# Patient Record
Sex: Female | Born: 1980 | Hispanic: Yes | Marital: Married | Smoking: Never smoker
Health system: Southern US, Community
[De-identification: ages and names within clinical notes are randomized; demographics above are authoritative.]

## PROBLEM LIST (undated history)

## (undated) DIAGNOSIS — E119 Type 2 diabetes mellitus without complications: Secondary | ICD-10-CM

## (undated) DIAGNOSIS — T7840XA Allergy, unspecified, initial encounter: Secondary | ICD-10-CM

## (undated) HISTORY — PX: WISDOM TOOTH EXTRACTION: SHX21

## (undated) HISTORY — DX: Type 2 diabetes mellitus without complications: E11.9

## (undated) HISTORY — PX: TUBAL LIGATION: SHX77

## (undated) HISTORY — DX: Allergy, unspecified, initial encounter: T78.40XA

---

## 2020-11-01 ENCOUNTER — Emergency Department: Payer: Medicaid Other

## 2020-11-01 ENCOUNTER — Other Ambulatory Visit: Payer: Self-pay

## 2020-11-01 ENCOUNTER — Emergency Department
Admission: EM | Admit: 2020-11-01 | Discharge: 2020-11-02 | Disposition: A | Discharge status: Home / Self Care | Payer: Medicaid Other | Attending: Emergency Medicine | Admitting: Emergency Medicine

## 2020-11-01 DIAGNOSIS — E1165 Type 2 diabetes mellitus with hyperglycemia: Secondary | ICD-10-CM | POA: Diagnosis not present

## 2020-11-01 DIAGNOSIS — R55 Syncope and collapse: Secondary | ICD-10-CM | POA: Insufficient documentation

## 2020-11-01 DIAGNOSIS — R079 Chest pain, unspecified: Secondary | ICD-10-CM | POA: Insufficient documentation

## 2020-11-01 DIAGNOSIS — Z7984 Long term (current) use of oral hypoglycemic drugs: Secondary | ICD-10-CM | POA: Diagnosis not present

## 2020-11-01 DIAGNOSIS — E119 Type 2 diabetes mellitus without complications: Secondary | ICD-10-CM

## 2020-11-01 LAB — BASIC METABOLIC PANEL
Anion gap: 12 (ref 5–15)
BUN: 15 mg/dL (ref 6–20)
CO2: 22 mmol/L (ref 22–32)
Calcium: 8.7 mg/dL — ABNORMAL LOW (ref 8.9–10.3)
Chloride: 102 mmol/L (ref 98–111)
Creatinine, Ser: 0.65 mg/dL (ref 0.44–1.00)
GFR, Estimated: >60 mL/min (ref >60)
Glucose, Bld: 228 mg/dL — ABNORMAL HIGH (ref 70–99)
Potassium: 3.9 mmol/L (ref 3.5–5.1)
Sodium: 136 mmol/L (ref 135–145)

## 2020-11-01 LAB — CBC
HCT: 41.1 % (ref 36.0–46.0)
Hemoglobin: 14 g/dL (ref 12.0–15.0)
MCH: 30.1 pg (ref 26.0–34.0)
MCHC: 34.1 g/dL (ref 30.0–36.0)
MCV: 88.4 fL (ref 80.0–100.0)
Platelets: 332 10*3/uL (ref 150–400)
RBC: 4.65 MIL/uL (ref 3.87–5.11)
RDW: 12.7 % (ref 11.5–15.5)
WBC: 10.6 10*3/uL — ABNORMAL HIGH (ref 4.0–10.5)
nRBC: 0 % (ref 0.0–0.2)

## 2020-11-01 LAB — TROPONIN I (HIGH SENSITIVITY)
Troponin I (High Sensitivity): <2 ng/L (ref <18)
Troponin I (High Sensitivity): <2 ng/L (ref <18)

## 2020-11-01 NOTE — ED Provider Notes (Signed)
[facility] Emergency Department Provider Note  ____________________________________________  Time seen: Approximately 11:55 PM  I have reviewed the triage vital signs and the nursing notes.   HISTORY  Chief Complaint Chest Pain   HPI Samantha Bonilla is a 39 y.o. female who presents for evaluation of CP. Patient reports at least 6 similar episodes in the past starting when she was 39 years old and with the last one being 2 years ago while she lived in [place] and was admitted for further work up which according to her was negative. Patient reports that when she gets upset she develops chest pain, she then starts to cry and hyperventilate, and ends up having a syncopal or near syncopal event.  That is what happened today while she was at work.  She reports near syncope.  She denies any current chest pain.  No personal or family history of sudden death, dysrhythmias.  She does not take any hormones.  She denies any personal or family history of blood clots, recent travel immobilization, leg pain or swelling, hemoptysis.  She denies smoking, alcohol or drugs.  No shortness of breath, no fever, no cough, no vomiting or diarrhea.  PMH Chest pain Syncope  Past Surgical History:  Procedure Laterality Date  . TUBAL LIGATION      Prior to Admission medications   Medication Sig Start Date End Date Taking? Authorizing Provider  metFORMIN (GLUCOPHAGE) 500 MG tablet Take 1 tablet (500 mg total) by mouth 2 (two) times daily with a meal. 11/02/20 11/02/21  Nita Sickle, MD    Allergies Penicillins  No family history on file.  Social History Social History   Tobacco Use  . Smoking status: Never Smoker  . Smokeless tobacco: Never Used  Substance Use Topics  . Alcohol use: Not Currently  . Drug use: Not on file    Review of Systems  Constitutional: Negative for fever. + near syncope Eyes: Negative for visual changes. ENT: Negative for sore  throat. Neck: No neck pain  Cardiovascular: + chest pain. Respiratory: Negative for shortness of breath. Gastrointestinal: Negative for abdominal pain, vomiting or diarrhea. Genitourinary: Negative for dysuria. Musculoskeletal: Negative for back pain. Skin: Negative for rash. Neurological: Negative for headaches, weakness or numbness. Psych: No SI or HI  ____________________________________________   PHYSICAL EXAM:  VITAL SIGNS: ED Triage Vitals  Enc Vitals Group     BP 11/01/20 1919 113/76     Pulse Rate 11/01/20 1919 82     Resp 11/01/20 1919 15     Temp 11/01/20 1919 98 F (36.7 C)     Temp src --      SpO2 11/01/20 1919 100 %     Weight 11/01/20 1924 188 lb (85.3 kg)     Height 11/01/20 1924 5' 0.63" (1.54 m)     Head Circumference --      Peak Flow --      Pain Score 11/01/20 1922 0     Pain Loc --      Pain Edu? --      Excl. in GC? --     Constitutional: Alert and oriented. Well appearing and in no apparent distress. HEENT:      Head: Normocephalic and atraumatic.         Eyes: Conjunctivae are normal. Sclera is non-icteric.       Mouth/Throat: Mucous membranes are moist.       Neck: Supple with no signs of meningismus.  No C-spine tenderness  Cardiovascular: Regular rate and rhythm. No murmurs, gallops, or rubs. 2+ symmetrical distal pulses are present in all extremities. No JVD. Respiratory: Normal respiratory effort. Lungs are clear to auscultation bilaterally. No wheezes, crackles, or rhonchi.  Gastrointestinal: Soft, non tender. Musculoskeletal: Nontender with normal range of motion in all extremities. No edema, cyanosis, or erythema of extremities.  No T and L-spine tenderness Neurologic: Normal speech and language. Face is symmetric. Moving all extremities. No gross focal neurologic deficits are appreciated. Skin: Skin is warm, dry and intact. No rash noted. Psychiatric: Mood and affect are normal. Speech and behavior are  normal.  ____________________________________________   LABS (all labs ordered are listed, but only abnormal results are displayed)  Labs Reviewed  BASIC METABOLIC PANEL - Abnormal; Notable for the following components:      Result Value   Glucose, Bld 228 (*)    Calcium 8.7 (*)    All other components within normal limits  CBC - Abnormal; Notable for the following components:   WBC 10.6 (*)    All other components within normal limits  URINE DRUG SCREEN, QUALITATIVE ([facility] ONLY)  HEMOGLOBIN A1C  POC URINE PREG, ED  TROPONIN I (HIGH SENSITIVITY)  TROPONIN I (HIGH SENSITIVITY)   ____________________________________________  EKG  ED ECG REPORT I, Nita Sickle, the attending physician, personally viewed and interpreted this ECG.   Normal sinus rhythm, normal intervals, normal axis, no STE or depressions, no evidence of HOCM, AV block, delta wave, ARVD, prolonged QTc, WPW, or Brugada.   ____________________________________________  RADIOLOGY  I have personally reviewed the images performed during this visit and I agree with the Radiologist's read.   Interpretation by Radiologist:  DG Chest 2 View  Result Date: 11/01/2020 CLINICAL DATA:  Sudden onset of left chest pain at work followed by near syncopal episode, nausea and shortness of breath. EXAM: CHEST - 2 VIEW COMPARISON:  None. FINDINGS: The heart size and mediastinal contours are normal. The lungs are clear. There is no pleural effusion or pneumothorax. No acute osseous findings are identified. IMPRESSION: No active cardiopulmonary process. Electronically Signed   By: Carey Bullocks M.D.   On: 11/01/2020 19:46     ____________________________________________   PROCEDURES  Procedure(s) performed:yes .1-3 Lead EKG Interpretation Performed by: Nita Sickle, MD Authorized by: Nita Sickle, MD     Interpretation: normal     ECG rate assessment: normal     Rhythm: sinus rhythm     Ectopy: none      Critical Care performed:  None ____________________________________________   INITIAL IMPRESSION / ASSESSMENT AND PLAN / ED COURSE  39 y.o. female who presents for evaluation of CP.  Seems patient has had several similar episodes where she develops chest pain when she is upset and hyperventilates which leads to a near syncopal event.  Here she is extremely well-appearing in no distress with normal vital signs.  She is asymptomatic with no chest pain or shortness of breath.  Her EKG shows no signs of ischemia or dysrhythmias.  She was monitored on telemetry with no signs of dysrhythmias as well.  Ddx panic attack, hyperventilation, anxiety. Sounds atypical for ACS, patient low risk with no known comorbidities normal EKG and 2 HS- troponin negative. Pregnancy negative. PERC negative. No signs of anemia on blood work. Patient does have elevated BG 228 consistent with new diagnosis of DM. No signs of DKA or any electrolyte derangements.  Hemoglobin A1c has been sent.  Will start patient on Metformin.  [facility].  Patient provided with extensive information about new diagnosis diabetes, dietary changes, exercise, and Metformin.  Discussed follow-up with PCP and my standard return precautions.  Several attempts to get patient's records from [facility] in [facility] were unsuccessful.      _____________________________________________ Please note:  Patient was evaluated in Emergency Department today for the symptoms described in the history of present illness. Patient was evaluated in the context of the global COVID-19 pandemic, which necessitated consideration that the patient might be at risk for infection with the SARS-CoV-2 virus that causes COVID-19. Institutional protocols and algorithms that pertain to the evaluation of patients at risk for COVID-19 are in a state of rapid change based on information released by regulatory bodies  including the CDC and federal and state organizations. These policies and algorithms were followed during the patient's care in the ED.  Some ED evaluations and interventions may be delayed as a result of limited staffing during the pandemic.   [place] Controlled Substance Database was reviewed by me. ____________________________________________   FINAL CLINICAL IMPRESSION(S) / ED DIAGNOSES   Final diagnoses:  Chest pain, unspecified type  New onset type 2 diabetes mellitus (HCC)      NEW MEDICATIONS STARTED DURING THIS VISIT:  ED Discharge Orders         Ordered    metFORMIN (GLUCOPHAGE) 500 MG tablet  2 times daily with meals        11/02/20 0103           Note:  This document was prepared using Dragon voice recognition software and may include unintentional dictation errors.   Don Perking, [place], MD 11/02/20 0111

## 2020-11-01 NOTE — ED Triage Notes (Addendum)
Patient reports sudden onset of left chest pain at work followed by near syncope, nausea, and SOB. Patient reports radiation to left arm. Patient describes pain as tightness/squeezing  Patient denies pain currently.

## 2020-11-02 LAB — URINE DRUG SCREEN, QUALITATIVE (ARMC ONLY)
Amphetamines, Ur Screen: NOT DETECTED
Barbiturates, Ur Screen: NOT DETECTED
Benzodiazepine, Ur Scrn: NOT DETECTED
Cannabinoid 50 Ng, Ur ~~LOC~~: NOT DETECTED
Cocaine Metabolite,Ur ~~LOC~~: NOT DETECTED
MDMA (Ecstasy)Ur Screen: NOT DETECTED
Methadone Scn, Ur: NOT DETECTED
Opiate, Ur Screen: NOT DETECTED
Phencyclidine (PCP) Ur S: NOT DETECTED
Tricyclic, Ur Screen: NOT DETECTED

## 2020-11-02 LAB — HEMOGLOBIN A1C
Hgb A1c MFr Bld: 9.6 % — ABNORMAL HIGH (ref 4.8–5.6)
Mean Plasma Glucose: 228.82 mg/dL

## 2020-11-02 LAB — POC URINE PREG, ED: Preg Test, Ur: NEGATIVE

## 2020-11-02 MED ORDER — METFORMIN HCL 500 MG PO TABS: 500.0000 mg | ORAL_TABLET | Freq: Two times a day (BID) | ORAL | 1 refills | Status: DC

## 2021-01-18 ENCOUNTER — Emergency Department: Payer: Medicaid Other

## 2021-01-18 ENCOUNTER — Other Ambulatory Visit: Payer: Self-pay

## 2021-01-18 ENCOUNTER — Emergency Department
Admission: EM | Admit: 2021-01-18 | Discharge: 2021-01-18 | Disposition: A | Discharge status: Home / Self Care | Payer: Medicaid Other | Attending: Emergency Medicine | Admitting: Emergency Medicine

## 2021-01-18 DIAGNOSIS — R29818 Other symptoms and signs involving the nervous system: Secondary | ICD-10-CM | POA: Diagnosis not present

## 2021-01-18 DIAGNOSIS — G43809 Other migraine, not intractable, without status migrainosus: Secondary | ICD-10-CM | POA: Diagnosis not present

## 2021-01-18 DIAGNOSIS — G43819 Other migraine, intractable, without status migrainosus: Secondary | ICD-10-CM | POA: Insufficient documentation

## 2021-01-18 DIAGNOSIS — R079 Chest pain, unspecified: Secondary | ICD-10-CM | POA: Diagnosis not present

## 2021-01-18 DIAGNOSIS — M25512 Pain in left shoulder: Secondary | ICD-10-CM | POA: Diagnosis not present

## 2021-01-18 DIAGNOSIS — R531 Weakness: Secondary | ICD-10-CM | POA: Diagnosis not present

## 2021-01-18 DIAGNOSIS — R29810 Facial weakness: Secondary | ICD-10-CM | POA: Diagnosis not present

## 2021-01-18 DIAGNOSIS — G43909 Migraine, unspecified, not intractable, without status migrainosus: Secondary | ICD-10-CM

## 2021-01-18 DIAGNOSIS — R519 Headache, unspecified: Secondary | ICD-10-CM | POA: Diagnosis not present

## 2021-01-18 DIAGNOSIS — R202 Paresthesia of skin: Secondary | ICD-10-CM | POA: Diagnosis present

## 2021-01-18 DIAGNOSIS — R29898 Other symptoms and signs involving the musculoskeletal system: Secondary | ICD-10-CM | POA: Diagnosis not present

## 2021-01-18 DIAGNOSIS — R4781 Slurred speech: Secondary | ICD-10-CM | POA: Diagnosis not present

## 2021-01-18 LAB — DIFFERENTIAL
Abs Immature Granulocytes: 0.02 10*3/uL (ref 0.00–0.07)
Basophils Absolute: 0.1 10*3/uL (ref 0.0–0.1)
Basophils Relative: 1 %
Eosinophils Absolute: 0.1 10*3/uL (ref 0.0–0.5)
Eosinophils Relative: 1 %
Immature Granulocytes: 0 %
Lymphocytes Relative: 33 %
Lymphs Abs: 3.3 10*3/uL (ref 0.7–4.0)
Monocytes Absolute: 0.6 10*3/uL (ref 0.1–1.0)
Monocytes Relative: 6 %
Neutro Abs: 5.9 10*3/uL (ref 1.7–7.7)
Neutrophils Relative %: 59 %

## 2021-01-18 LAB — COMPREHENSIVE METABOLIC PANEL
ALT: 23 U/L (ref 0–44)
AST: 23 U/L (ref 15–41)
Albumin: 4 g/dL (ref 3.5–5.0)
Alkaline Phosphatase: 71 U/L (ref 38–126)
Anion gap: 10 (ref 5–15)
BUN: 11 mg/dL (ref 6–20)
CO2: 24 mmol/L (ref 22–32)
Calcium: 9.4 mg/dL (ref 8.9–10.3)
Chloride: 103 mmol/L (ref 98–111)
Creatinine, Ser: 0.71 mg/dL (ref 0.44–1.00)
GFR, Estimated: >60 mL/min (ref >60)
Glucose, Bld: 183 mg/dL — ABNORMAL HIGH (ref 70–99)
Potassium: 4.1 mmol/L (ref 3.5–5.1)
Sodium: 137 mmol/L (ref 135–145)
Total Bilirubin: 1.3 mg/dL — ABNORMAL HIGH (ref 0.3–1.2)
Total Protein: 7.3 g/dL (ref 6.5–8.1)

## 2021-01-18 LAB — CBC
HCT: 41.1 % (ref 36.0–46.0)
Hemoglobin: 14.2 g/dL (ref 12.0–15.0)
MCH: 30.7 pg (ref 26.0–34.0)
MCHC: 34.5 g/dL (ref 30.0–36.0)
MCV: 88.8 fL (ref 80.0–100.0)
Platelets: 253 10*3/uL (ref 150–400)
RBC: 4.63 MIL/uL (ref 3.87–5.11)
RDW: 12.7 % (ref 11.5–15.5)
WBC: 10 10*3/uL (ref 4.0–10.5)
nRBC: 0 % (ref 0.0–0.2)

## 2021-01-18 LAB — TROPONIN I (HIGH SENSITIVITY)
Troponin I (High Sensitivity): <2 ng/L (ref <18)
Troponin I (High Sensitivity): <2 ng/L (ref <18)

## 2021-01-18 LAB — POC URINE PREG, ED: Preg Test, Ur: NEGATIVE

## 2021-01-18 LAB — CBG MONITORING, ED: Glucose-Capillary: 170 mg/dL — ABNORMAL HIGH (ref 70–99)

## 2021-01-18 MED ORDER — METOCLOPRAMIDE HCL 5 MG/ML IJ SOLN
10.0000 mg | Freq: Once | INTRAMUSCULAR | Status: AC
  Administered 2021-01-18: 10 mg via INTRAVENOUS
  Filled 2021-01-18: qty 2

## 2021-01-18 MED ORDER — DIPHENHYDRAMINE HCL 50 MG/ML IJ SOLN
12.5000 mg | Freq: Once | INTRAMUSCULAR | Status: AC
  Administered 2021-01-18: 12.5 mg via INTRAVENOUS
  Filled 2021-01-18: qty 1

## 2021-01-18 MED ORDER — ACETAMINOPHEN 325 MG PO TABS
650.0000 mg | ORAL_TABLET | Freq: Once | ORAL | Status: AC
  Administered 2021-01-18: 650 mg via ORAL
  Filled 2021-01-18: qty 2

## 2021-01-18 MED ORDER — SODIUM CHLORIDE 0.9% FLUSH
3.0000 mL | Freq: Once | INTRAVENOUS | Status: AC
  Administered 2021-01-18: 3 mL via INTRAVENOUS

## 2021-01-18 NOTE — ED Notes (Signed)
Patient transported to X-ray 

## 2021-01-18 NOTE — ED Notes (Signed)
Code stroke called to [facility] [phone]

## 2021-01-18 NOTE — ED Provider Notes (Signed)
[facility] Emergency Department Provider Note  ____________________________________________   Event Date/Time   First MD Initiated Contact with Patient 01/18/21 1457     (approximate)  I have reviewed the triage vital signs and the nursing notes.   HISTORY  Chief Complaint Possible Stroke    HPI Samantha Bonilla is a 40 y.o. female with history of migraines who comes in with tingling and weakness.  Patient states that she had a headache that started last night.  She then went to bed at 8 AM for a nap and woke up at 11 with left sided arm weakness and sensation changes on the left side of her body.  This is been constant, nothing made it better, nothing made it worse.  She states that she does have some pain in her left shoulder but it is only with movement and occasionally radiates into her chest.  When she is completely still she has no pain.  She states that she has had migraines in the past that she has had some tingling but never had the weakness before which is what made her present today.  She has never had a stroke previously.  She is on a blood thinners.  She denies any shortness of breath, abdominal pain.          History reviewed. No pertinent past medical history.  There are no problems to display for this patient.   Past Surgical History:  Procedure Laterality Date  . TUBAL LIGATION      Prior to Admission medications   Medication Sig Start Date End Date Taking? Authorizing Provider  metFORMIN (GLUCOPHAGE) 500 MG tablet Take 1 tablet (500 mg total) by mouth 2 (two) times daily with a meal. 11/02/20 11/02/21  Nita Sickle, MD    Allergies Penicillins  No family history on file.  Social History Social History   Tobacco Use  . Smoking status: Never Smoker  . Smokeless tobacco: Never Used  Substance Use Topics  . Alcohol use: Not Currently      Review of Systems Constitutional: No fever/chills Eyes: No visual  changes. ENT: No sore throat. Cardiovascular: Positive chest pain Respiratory: Denies shortness of breath. Gastrointestinal: No abdominal pain.  No nausea, no vomiting.  No diarrhea.  No constipation. Genitourinary: Negative for dysuria. Musculoskeletal: Negative for back pain.  Positive shoulder pain Skin: Negative for rash. Neurological: Positive headache and left-sided tingling and weakness all other ROS negative ____________________________________________   PHYSICAL EXAM:  VITAL SIGNS: ED Triage Vitals  Enc Vitals Group     BP 01/18/21 1450 127/72     Pulse Rate 01/18/21 1450 72     Resp 01/18/21 1450 18     Temp 01/18/21 1450 98 F (36.7 C)     Temp src --      SpO2 01/18/21 1450 100 %     Weight 01/18/21 1518 176 lb 12.9 oz (80.2 kg)     Height --      Head Circumference --      Peak Flow --      Pain Score 01/18/21 1451 6     Pain Loc --      Pain Edu? --      Excl. in GC? --     Constitutional: Alert and oriented. Well appearing and in no acute distress. Eyes: Conjunctivae are normal. EOMI. Head: Atraumatic. Nose: No congestion/rhinnorhea. Mouth/Throat: Mucous membranes are moist.   Neck: No stridor. Trachea Midline. FROM Cardiovascular: Normal rate, regular  rhythm. Grossly normal heart sounds.  Good peripheral circulation. Respiratory: Normal respiratory effort.  No retractions. Lungs CTAB. Gastrointestinal: Soft and nontender. No distention. No abdominal bruits.  Musculoskeletal: No lower extremity tenderness nor edema.  No joint effusions. Neurologic: NIH stroke scale of 2 for sensation changes on the face and the left arm. Skin:  Skin is warm, dry and intact. No rash noted. Psychiatric: Mood and affect are normal. Speech and behavior are normal. GU: Deferred   ____________________________________________   LABS (all labs ordered are listed, but only abnormal results are displayed)  Labs Reviewed  CBG MONITORING, ED - Abnormal; Notable for the  following components:      Result Value   Glucose-Capillary 170 (*)    All other components within normal limits  CBC  DIFFERENTIAL  COMPREHENSIVE METABOLIC PANEL  POC URINE PREG, ED  I-STAT CREATININE, ED  TROPONIN I (HIGH SENSITIVITY)   ____________________________________________   ED ECG REPORT I, Concha Se, the attending physician, personally viewed and interpreted this ECG.  Normal sinus rate of 77, no ST elevation, no T wave inversions, normal intervals ____________________________________________  RADIOLOGY Vela Prose, personally viewed and evaluated these images (plain radiographs) as part of my medical decision making, as well as reviewing the written report by the radiologist.  ED MD interpretation: CT head negative for intracranial hemorrhage  Official radiology report(s): CT HEAD CODE STROKE WO CONTRAST  Result Date: 01/18/2021 CLINICAL DATA:  Code stroke. Acute neuro deficit. Headache. Weakness and slurred speech EXAM: CT HEAD WITHOUT CONTRAST TECHNIQUE: Contiguous axial images were obtained from the base of the skull through the vertex without intravenous contrast. COMPARISON:  None. FINDINGS: Brain: No evidence of acute infarction, hemorrhage, hydrocephalus, extra-axial collection or mass lesion/mass effect. Empty sella, likely incidental. Vascular: Negative for hyperdense vessel Skull: Negative Sinuses/Orbits: Paranasal sinuses clear.  Negative orbit Other: None ASPECTS (Alberta Stroke Program Early CT Score) - Ganglionic level infarction (caudate, lentiform nuclei, internal capsule, insula, M1-M3 cortex): 7 - Supraganglionic infarction (M4-M6 cortex): 3 Total score (0-10 with 10 being normal): 10 IMPRESSION: 1. Negative CT head 2. ASPECTS is 10 3. These results were called by telephone at the time of interpretation on 01/18/2021 at 3:07 pm to provider Z Katrinka Blazing, who verbally acknowledged these results. Electronically Signed   By: Marlan Palau M.D.   On: 01/18/2021  15:08    ____________________________________________   PROCEDURES  Procedure(s) performed (including Critical Care):  Procedures   ____________________________________________   INITIAL IMPRESSION / ASSESSMENT AND PLAN / ED COURSE  Samantha Bonilla was evaluated in Emergency Department on 01/18/2021 for the symptoms described in the history of present illness. She was evaluated in the context of the global COVID-19 pandemic, which necessitated consideration that the patient might be at risk for infection with the SARS-CoV-2 virus that causes COVID-19. Institutional protocols and algorithms that pertain to the evaluation of patients at risk for COVID-19 are in a state of rapid change based on information released by regulatory bodies including the CDC and federal and state organizations. These policies and algorithms were followed during the patient's care in the ED.     Patient is a 40 year old Spanish-speaking patient who comes in with headache and left-sided weakness.  Last known normal was 8 AM on 01/18/2021.  Patient's of the window of TPA but stroke code was called from triage.  Patient exam seems to be changing more occasionally seems to have weakness in her hands and occasionally she is able to have good  strength in her left hand.  Patient was evaluated by neurology.  CT head was ordered to evaluate for intercranial hemorrhage.  This time low suspicion for LVO and her NIH stroke scale is so low that she would not be a candidate for thrombectomy.  They did recommend MRI to rule out stroke although our suspicion that this is more likely a complex migraine.  I did also consider the thought of dissection given patient reports some shoulder pain and chest pain however she has no pain when sitting completely still and her pain is only when she moves her left shoulder.  She has good pulses throughout chest x-ray does not show any widened mediastinum.  However I will get EKG and cardiac  markers to evaluate for ACS.  However again I suspect that this is more likely musculoskeletal in nature.  Labs are reassuring.  Initial cardiac marker is negative.  CT head is negative  5:17 PM symptoms are resolving.  Stroke scale was currently 0 still pending MRI  6:04 PM reevaluated patient and she has no symptoms at this time.  Denies any pain.  Still waiting for MRI.  Updated on results thus far.  7:03 PM MRI is negative.  Patient mains asymptomatic.  This time will discharge home.  Most likely complex migraine.    ____________________________________________   FINAL CLINICAL IMPRESSION(S) / ED DIAGNOSES   Final diagnoses:  Other migraine without status migrainosus, intractable      MEDICATIONS GIVEN DURING THIS VISIT:  Medications  sodium chloride flush (NS) 0.9 % injection 3 mL (3 mLs Intravenous Given 01/18/21 1612)  metoCLOPramide (REGLAN) injection 10 mg (10 mg Intravenous Given 01/18/21 1605)  diphenhydrAMINE (BENADRYL) injection 12.5 mg (12.5 mg Intravenous Given 01/18/21 1605)  acetaminophen (TYLENOL) tablet 650 mg (650 mg Oral Given 01/18/21 1612)     ED Discharge Orders    None       Note:  This document was prepared using Dragon voice recognition software and may include unintentional dictation errors.   Concha Se, MD 01/18/21 [phone]

## 2021-01-18 NOTE — ED Triage Notes (Addendum)
Pt comes via POV from home with c/o headache that started around 8pm last night. Pt then had some arm numbness, weakness, slurred speech  that all started at 11am.   Currently no slurred speech. Pt states pain in left arm from shoulder to chest. Pt states stiff neck.  Pt states touch is different in left arm vs right arm.

## 2021-01-18 NOTE — ED Notes (Signed)
Pt ambulatory to toilet in the room with touch assist x1

## 2021-01-18 NOTE — Consult Note (Signed)
Neurology Consultation Reason for Consult: Left sided numbness Referring Physician: Rennis Chris  CC: Headache  History is obtained from: Patient  HPI: Samantha Bonilla is a 40 y.o. female with a history of migraines who presents with left-sided numbness and visual change that was present on awakening at 11 AM.  She felt normal when she took her kids to school other than the five that she already had a headache, when she came back home she laid down for a nap at 8 AM, and when she awoke at 11 AM she noticed a pretty distinct difference.  She noticed some pain in her left arm, as well as numbness and weakness of the arm.  Her headache is on the entire left side of her head from the front to the back, unilateral, associated with photophobia.  LKW: 8 AM tpa given?: no, out of window    ROS: A 14 point ROS was performed and is negative except as noted in the HPI.  History reviewed. No pertinent past medical history.   FHx: Grandmother - stroke  Social History:  reports that she has never smoked. She has never used smokeless tobacco. She reports previous alcohol use. No history on file for drug use.   Exam: Current vital signs: BP 121/75 (BP Location: Right Arm)   Pulse 81   Temp 98 F (36.7 C)   Resp (!) 24   Wt 80.2 kg   SpO2 100%   BMI 33.82 kg/m  Vital signs in last 24 hours: Temp:  [98 F (36.7 C)] 98 F (36.7 C) (02/03 1450) Pulse Rate:  [72-81] 81 (02/03 1536) Resp:  [18-24] 24 (02/03 1536) BP: (121-127)/(72-75) 121/75 (02/03 1536) SpO2:  [100 %] 100 % (02/03 1536) Weight:  [80.2 kg] 80.2 kg (02/03 1518)   Physical Exam  Constitutional: Appears well-developed and well-nourished.  Psych: Affect appropriate to situation Eyes: No scleral injection HENT: No OP obstruction MSK: no joint deformities.  Cardiovascular: Normal rate and regular rhythm.  Respiratory: Effort normal, non-labored breathing GI: Soft.  No distension. There is no tenderness.  Skin:  WDI  Neuro: Mental Status: Patient is awake, alert, oriented to person, place, month, year, and situation. Patient is able to give a clear and coherent history. No signs of aphasia or neglect Cranial Nerves: II: Visual Fields are full. Pupils are equal, round, and reactive to light.   III,IV, VI: EOMI without ptosis or diploplia.  V: Facial sensation is symmetric to temperature VII: Facial movement with decreased movement on the right side of her face VIII: hearing is intact to voice X: Uvula elevates symmetrically XI: Shoulder shrug is symmetric. XII: tongue is midline without atrophy or fasciculations.  Motor: Tone is normal. Bulk is normal. 5/5 strength was present in all four extremities.  She initially does not give very good effort in her left upper extremity, but with encouragement she does get full strength.  She does not have any drift. Sensory: Sensation is diminished throughout the left side Cerebellar: No clear ataxia on finger-nose-finger or heel-knee-shin     I have reviewed labs in epic and the results pertinent to this consultation are: Glucose 183  I have reviewed the images obtained: CT head-negative  Impression: 40 year old female with a history of headaches that sound very much like migraines who presents with headache since yesterday as well as some numbness on the left side.  She has a history of complicated migraine (visual aura) and I suspect that that is what we are  dealing with now.  Her symptoms are mild and she would not be an interventional candidate, and is outside the window for IV TPA therefore I would pursue diagnosis with MRI.  If this is negative, then no further work-up is needed.  Recommendations: 1) MRI brain 2) if negative then no further work-up is needed.   Ritta Slot, MD [facility] [phone]  If 7pm- 7am, please page neurology on call as listed in AMION.

## 2021-01-18 NOTE — Progress Notes (Signed)
CODE STROKE- PHARMACY COMMUNICATION   Time CODE STROKE called/page received: 1500  Time response to CODE STROKE was made: 1510  Time Stroke Kit retrieved from Pyxis: N/A, no tPA per neurologist  Name of Provider/Nurse contacted: Dr. Rickard Patience 01/18/2021  3:26 PM

## 2021-01-18 NOTE — ED Notes (Signed)
Pt transported to CT at this time and then to ED 13 for further evaluation. RN Zollie Scale at bedside to transport pt.

## 2021-01-18 NOTE — ED Notes (Signed)
MD Fuller Plan assessing pt at this time. Code Stroke already called by this RN. MD Fuller Plan agrees to continue CODE STROKE CALL and proceed to CT

## 2021-01-18 NOTE — ED Notes (Signed)
CBG 170 

## 2021-01-18 NOTE — ED Notes (Signed)
Patient transported to MRI 

## 2021-01-18 NOTE — Discharge Instructions (Addendum)
Your MRI was negative for stroke.  We suspect this is more likely a complex migraine.  Take Tylenol 1 g every 8 hours or ibuprofen 600 every 6 hours for pain.  Return to the ER for worsening symptoms or any other concerns

## 2021-01-29 DIAGNOSIS — M62838 Other muscle spasm: Secondary | ICD-10-CM | POA: Diagnosis not present

## 2021-01-29 DIAGNOSIS — S46812A Strain of other muscles, fascia and tendons at shoulder and upper arm level, left arm, initial encounter: Secondary | ICD-10-CM | POA: Diagnosis not present

## 2021-03-17 DIAGNOSIS — H524 Presbyopia: Secondary | ICD-10-CM | POA: Diagnosis not present

## 2021-03-17 DIAGNOSIS — H5203 Hypermetropia, bilateral: Secondary | ICD-10-CM | POA: Diagnosis not present

## 2021-03-17 DIAGNOSIS — H52223 Regular astigmatism, bilateral: Secondary | ICD-10-CM | POA: Diagnosis not present

## 2021-04-09 DIAGNOSIS — H60502 Unspecified acute noninfective otitis externa, left ear: Secondary | ICD-10-CM | POA: Diagnosis not present

## 2021-04-27 DIAGNOSIS — H5213 Myopia, bilateral: Secondary | ICD-10-CM | POA: Diagnosis not present

## 2021-07-10 DIAGNOSIS — R300 Dysuria: Secondary | ICD-10-CM | POA: Diagnosis not present

## 2021-07-10 DIAGNOSIS — N23 Unspecified renal colic: Secondary | ICD-10-CM | POA: Diagnosis not present

## 2021-07-10 DIAGNOSIS — R81 Glycosuria: Secondary | ICD-10-CM | POA: Diagnosis not present

## 2021-09-07 DIAGNOSIS — K219 Gastro-esophageal reflux disease without esophagitis: Secondary | ICD-10-CM | POA: Diagnosis not present

## 2021-09-07 DIAGNOSIS — Z8673 Personal history of transient ischemic attack (TIA), and cerebral infarction without residual deficits: Secondary | ICD-10-CM | POA: Diagnosis not present

## 2021-09-07 DIAGNOSIS — R739 Hyperglycemia, unspecified: Secondary | ICD-10-CM | POA: Diagnosis not present

## 2021-09-19 DIAGNOSIS — Z20822 Contact with and (suspected) exposure to covid-19: Secondary | ICD-10-CM | POA: Diagnosis not present

## 2022-03-11 DIAGNOSIS — A084 Viral intestinal infection, unspecified: Secondary | ICD-10-CM | POA: Diagnosis not present

## 2022-03-11 DIAGNOSIS — R12 Heartburn: Secondary | ICD-10-CM | POA: Diagnosis not present

## 2022-04-09 DIAGNOSIS — S52501D Unspecified fracture of the lower end of right radius, subsequent encounter for closed fracture with routine healing: Secondary | ICD-10-CM | POA: Diagnosis not present

## 2022-04-11 DIAGNOSIS — J019 Acute sinusitis, unspecified: Secondary | ICD-10-CM | POA: Diagnosis not present

## 2022-04-11 DIAGNOSIS — B9689 Other specified bacterial agents as the cause of diseases classified elsewhere: Secondary | ICD-10-CM | POA: Diagnosis not present

## 2022-04-20 IMAGING — CR DG CHEST 2V
1 series · 2 of 2 positions shown · non-contrast
Comparison: None.

CLINICAL DATA: Sudden onset of left chest pain at work followed by
near syncopal episode, nausea and shortness of breath.

EXAM:
CHEST - 2 VIEW

[Series 1: dg chest 2 view · 0.14mm/px · 2 of 2 slices shown]
[im 1/2]
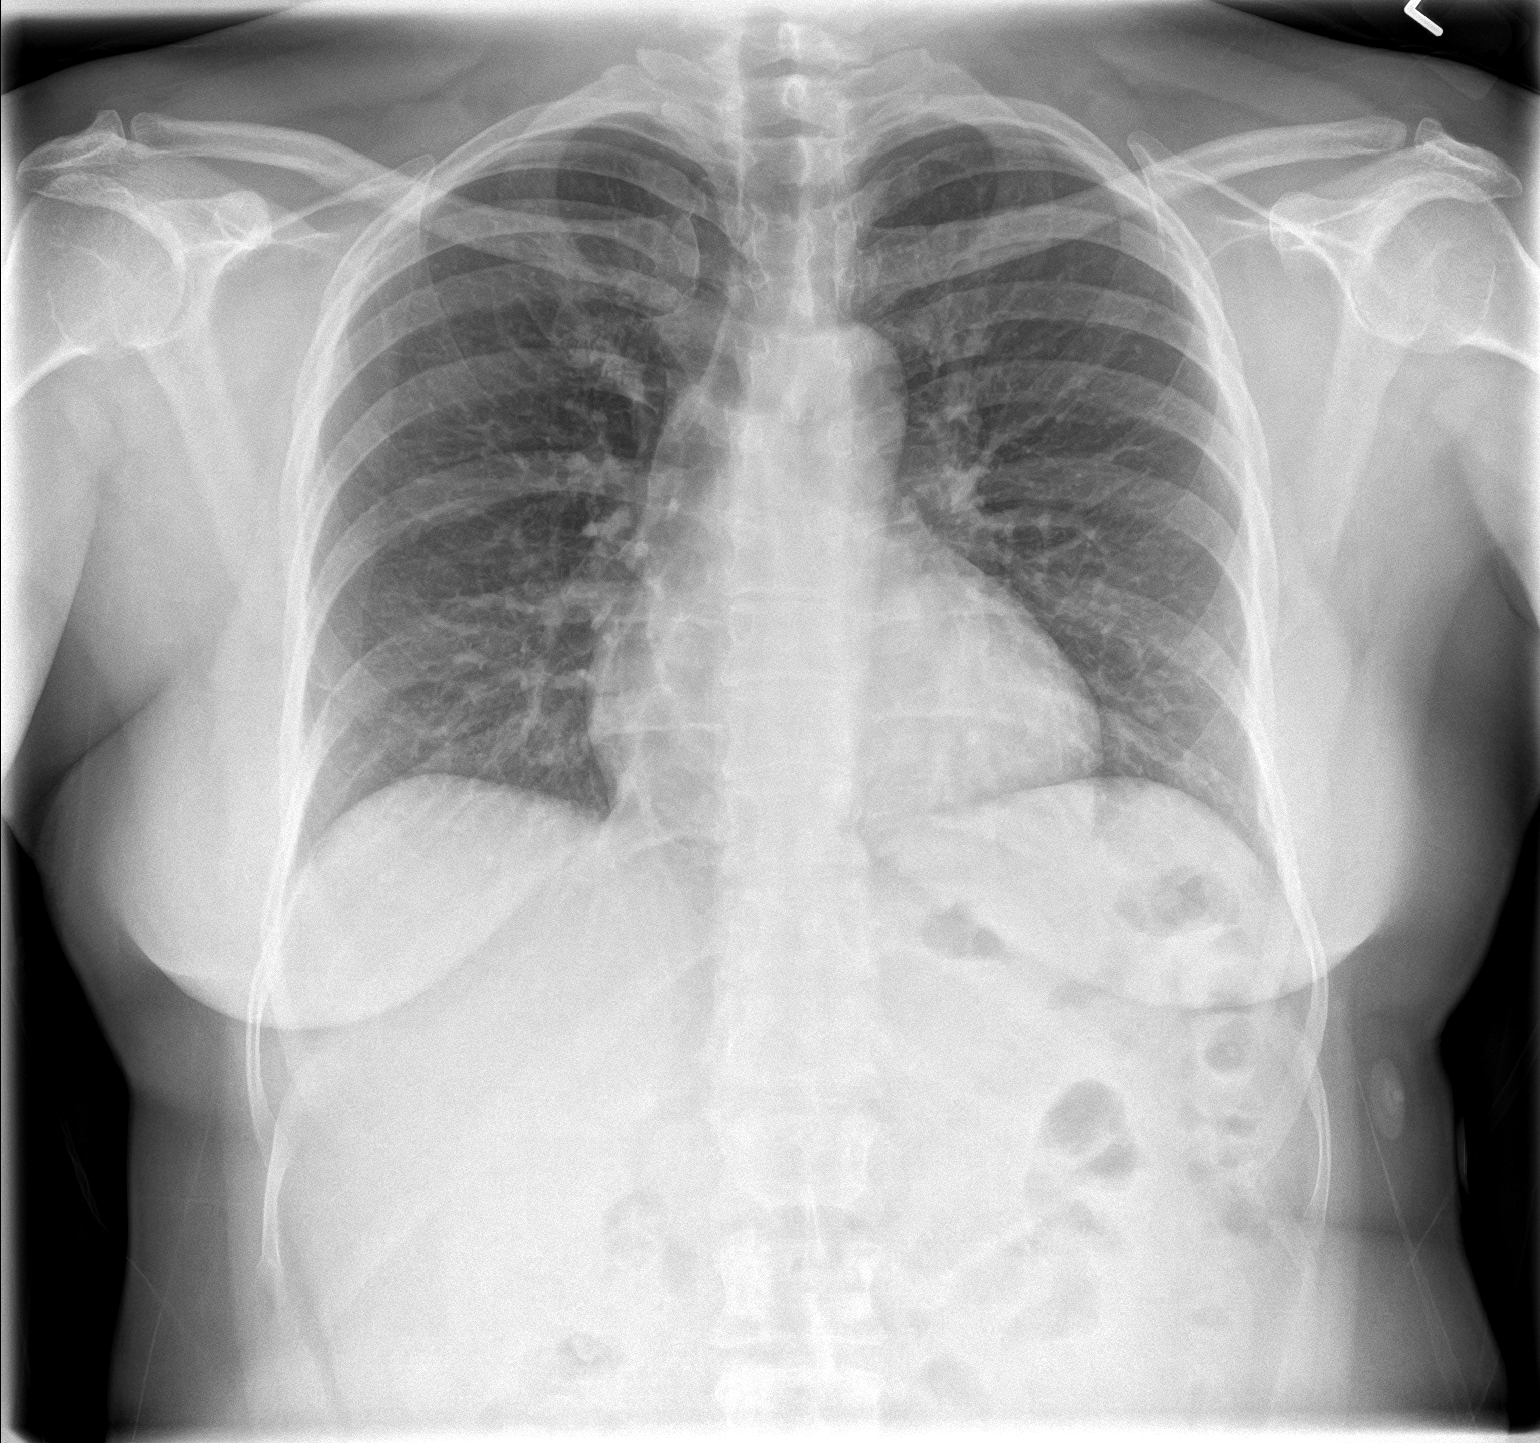
[im 2/2]
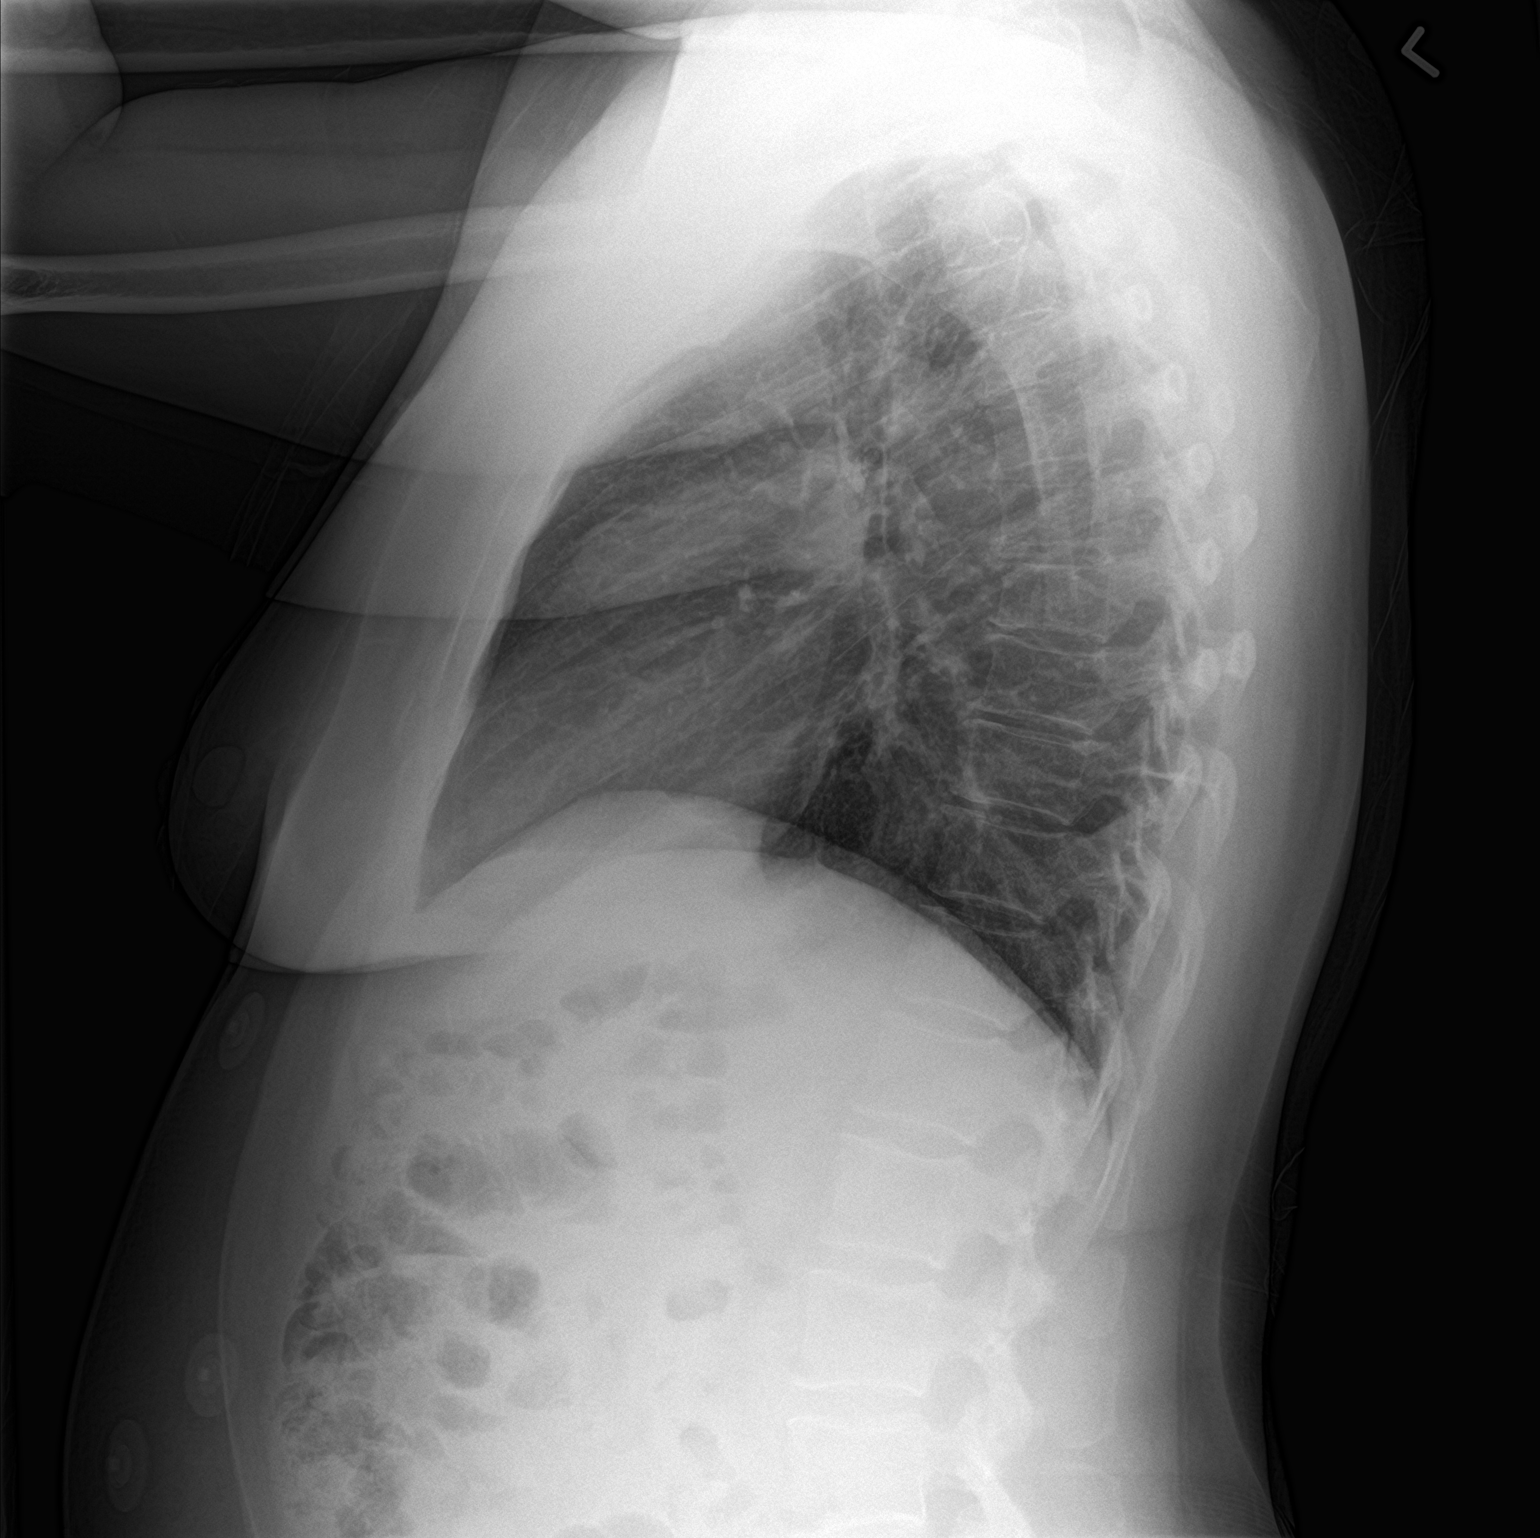

[2 of 2 positions shown; findings below may reference images not displayed]

FINDINGS: The heart size and mediastinal contours are normal. The lungs are
clear. There is no pleural effusion or pneumothorax. No acute
osseous findings are identified.
IMPRESSION: No active cardiopulmonary process.

## 2022-05-07 ENCOUNTER — Other Ambulatory Visit: Payer: Self-pay | Admitting: Physician Assistant

## 2022-05-07 DIAGNOSIS — M25532 Pain in left wrist: Secondary | ICD-10-CM

## 2022-05-22 ENCOUNTER — Ambulatory Visit
Admission: RE | Admit: 2022-05-22 | Discharge: 2022-05-22 | Disposition: A | Discharge status: Home / Self Care | Payer: Worker's Compensation | Source: Ambulatory Visit | Attending: Physician Assistant | Admitting: Physician Assistant

## 2022-05-22 DIAGNOSIS — M25532 Pain in left wrist: Secondary | ICD-10-CM

## 2022-06-04 DIAGNOSIS — M25532 Pain in left wrist: Secondary | ICD-10-CM

## 2022-06-04 HISTORY — DX: Pain in left wrist: M25.532

## 2022-07-07 IMAGING — CT CT HEAD CODE STROKE
3 series · 15 of 47 positions shown, 18 images · non-contrast
Comparison: None.

CLINICAL DATA: Code stroke. Acute neuro deficit. Headache. Weakness
and slurred speech

EXAM:
CT HEAD WITHOUT CONTRAST
TECHNIQUE: Contiguous axial images were obtained from the base of the skull
through the vertex without intravenous contrast.

[Series 3: head wo · axial · 0.41mm/px · z∈[-142,-17]mm · 9 of 31 slices shown, 12 images]
[im 3/31  brain]
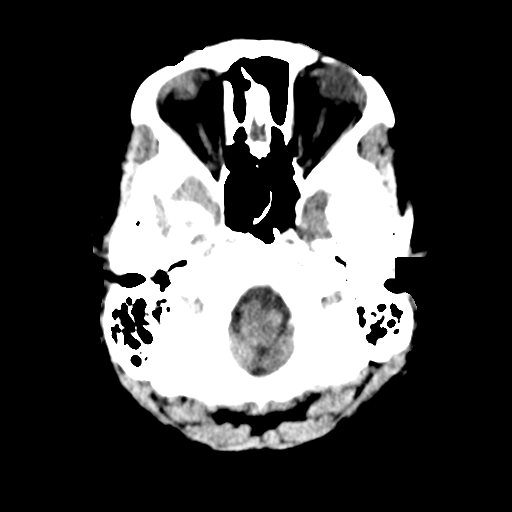
[im 3/31  bone]
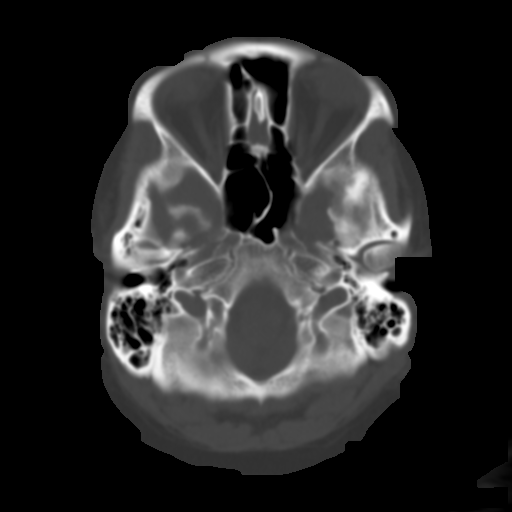
[im 6/31  brain]
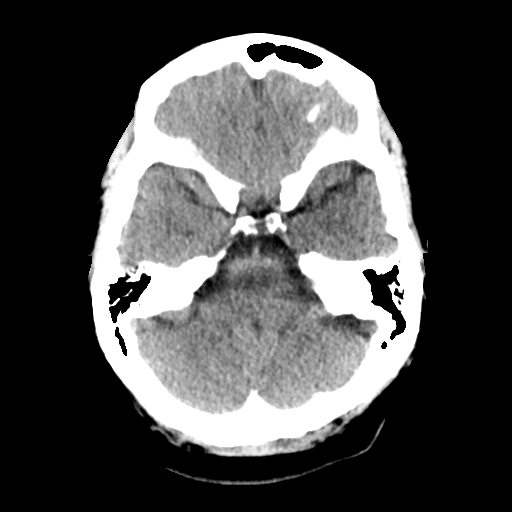
[im 9/31  brain]
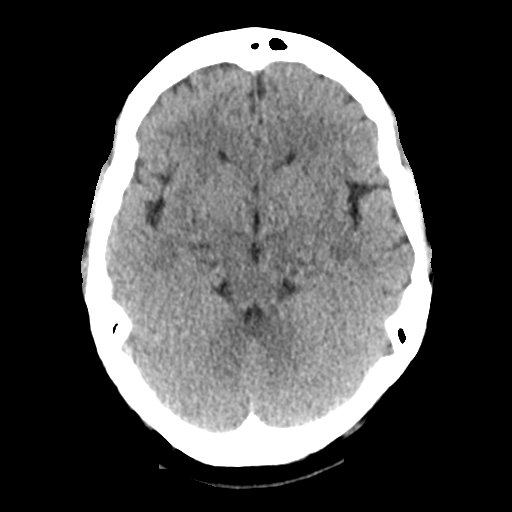
[im 12/31  brain]
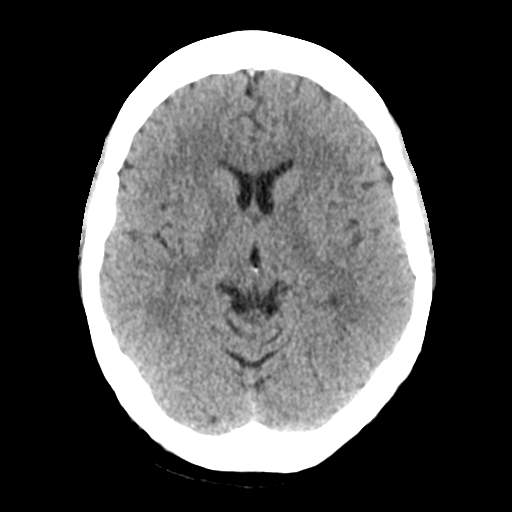
[im 16/31  brain]
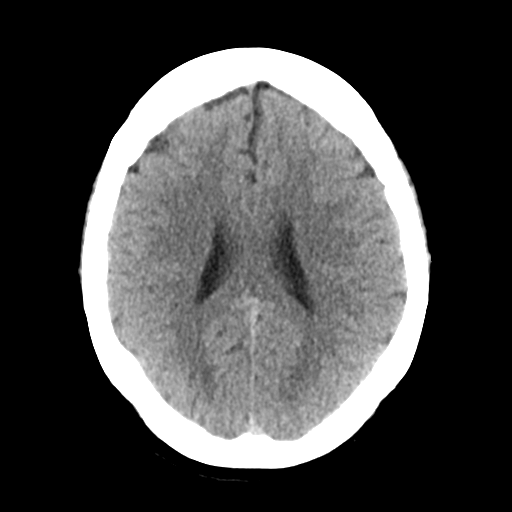
[im 16/31  bone]
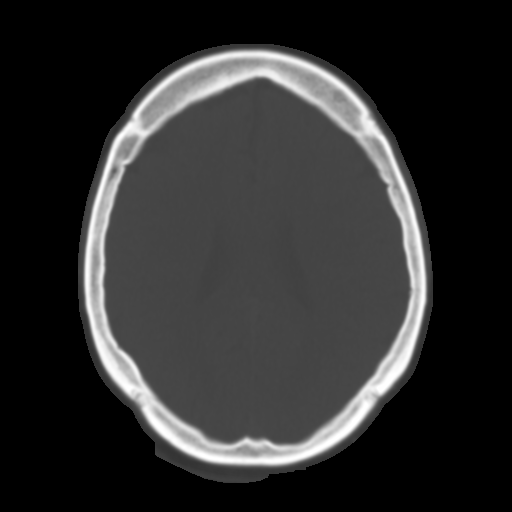
[im 19/31  brain]
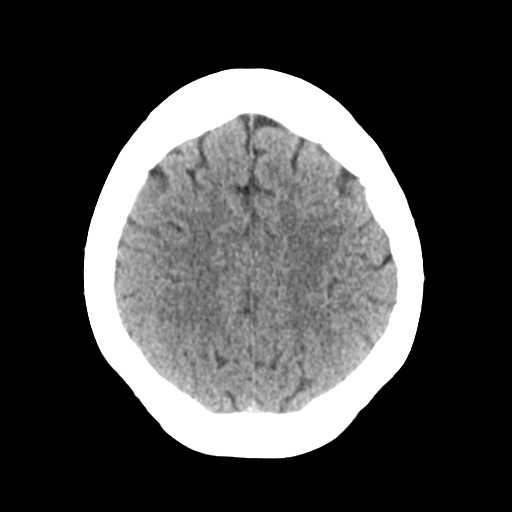
[im 22/31  brain]
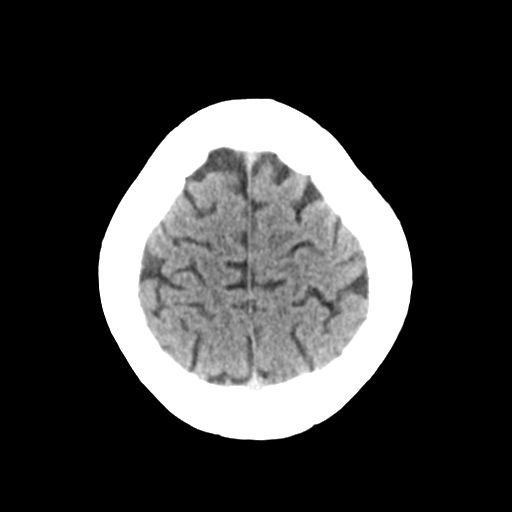
[im 25/31  brain]
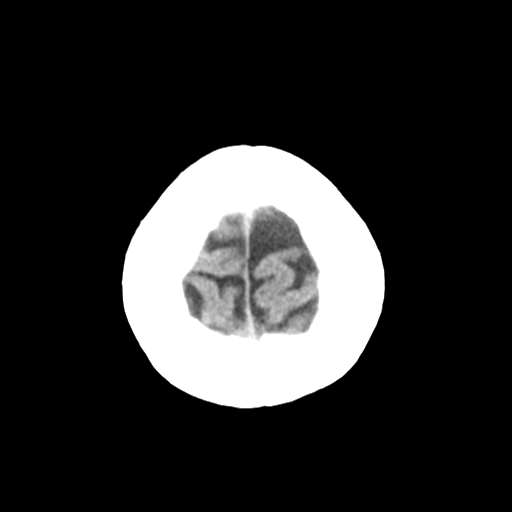
[im 28/31  brain]
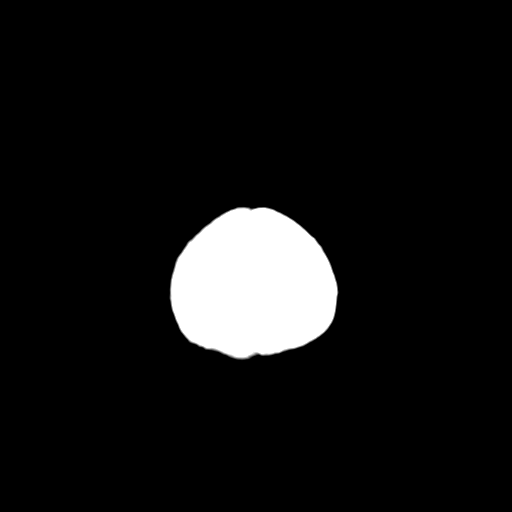
[im 28/31  bone]
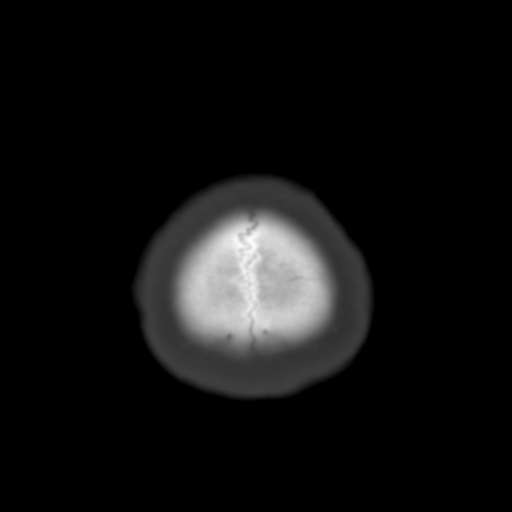

[Series 5: coronal soft tissue · coronal · 0.31mm/px · 3 of 64 slices shown]
[im 22/64  brain]
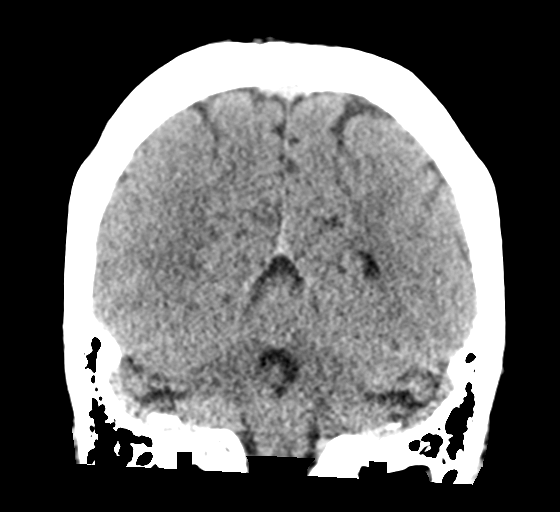
[im 29/64  brain]
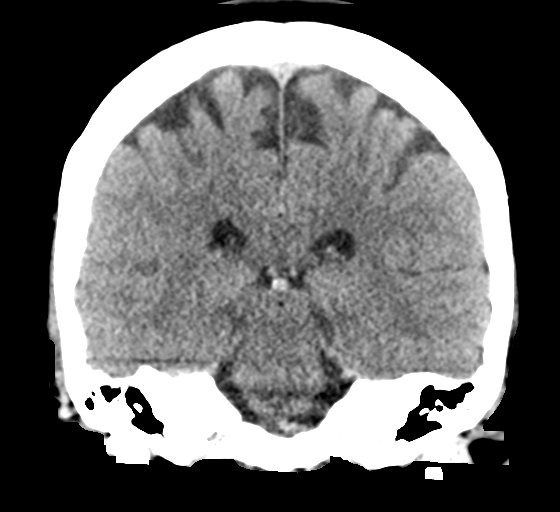
[im 36/64  brain]
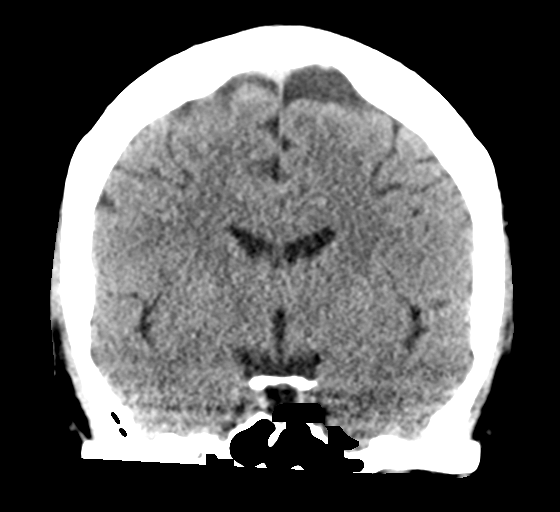

[Series 6: sagittal soft tissue · sagittal · 0.33mm/px · 3 of 55 slices shown]
[im 19/55  brain]
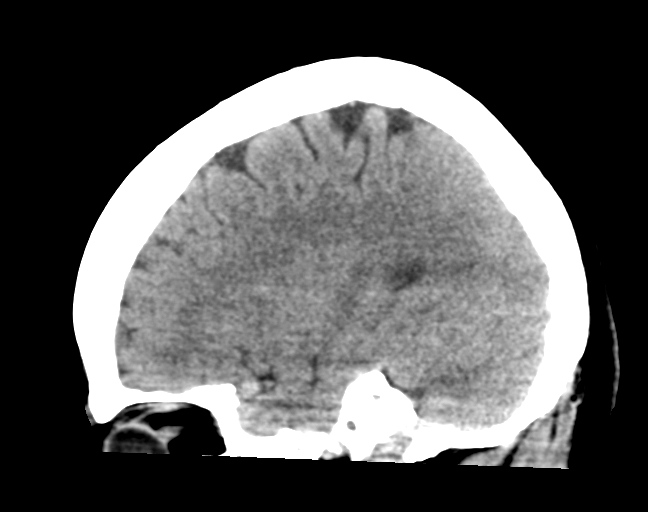
[im 28/55  brain]
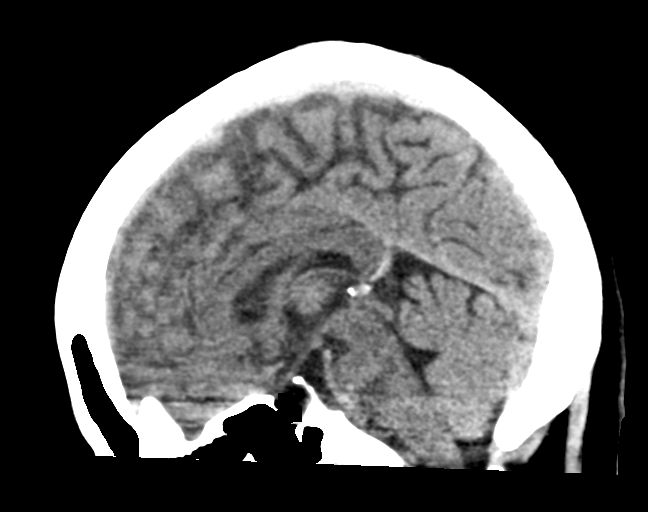
[im 37/55  brain]
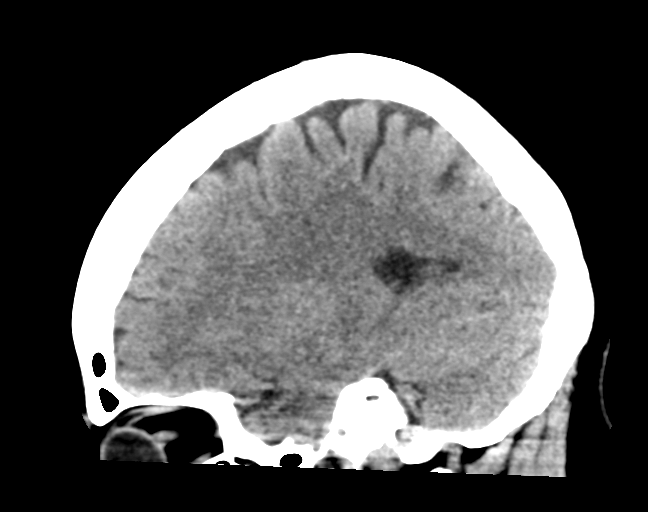

[15 of 47 positions shown; findings below may reference images not displayed]

FINDINGS: Brain: No evidence of acute infarction, hemorrhage, hydrocephalus,
extra-axial collection or mass lesion/mass effect. Empty sella,
likely incidental.

Vascular: Negative for hyperdense vessel

Skull: Negative

Sinuses/Orbits: Paranasal sinuses clear.  Negative orbit

Other: None

ASPECTS (Alberta Stroke Program Early CT Score)

- Ganglionic level infarction (caudate, lentiform nuclei, internal
capsule, insula, M1-M3 cortex): 7

- Supraganglionic infarction (M4-M6 cortex): 3

Total score (0-10 with 10 being normal): 10
IMPRESSION: 1. Negative CT head
2. ASPECTS is 10
3. These results were called by telephone at the time of
interpretation on 01/18/2021 at [DATE] to provider Tsikita Jeta, who
verbally acknowledged these results.

## 2022-08-27 DIAGNOSIS — H6122 Impacted cerumen, left ear: Secondary | ICD-10-CM | POA: Diagnosis not present

## 2022-08-27 DIAGNOSIS — H6692 Otitis media, unspecified, left ear: Secondary | ICD-10-CM | POA: Diagnosis not present

## 2022-08-30 ENCOUNTER — Telehealth: Payer: Self-pay

## 2022-08-30 NOTE — Telephone Encounter (Signed)
Patient called to connect with a Primary Care Provider. Patient has Managed Medicaid. LVM for patient to call [phone] to discuss making an appoint with a Primary Care Provider. If patient returns call, please reach out to Johnney Killian, Charity fundraiser.    Used Spanish interpreter # [phone].

## 2022-09-12 ENCOUNTER — Telehealth: Payer: Self-pay

## 2022-09-12 NOTE — Telephone Encounter (Signed)
Used Spanish interpreter, Elberta Fortis, # [phone].Inquiring if pt. would be interested in setting up with a PCP. Wrong contact number. No other phone numbers in chart.

## 2022-09-18 ENCOUNTER — Telehealth: Payer: Self-pay

## 2022-09-18 NOTE — Telephone Encounter (Signed)
Using Patent attorney and called pt- A VM with the person named Samantha Bonilla" was on VM. Attempted x 2 without success.  Patient called to connect with a Primary Care Provider. Unable to leave VM, VM not set up/VM full. Patient has Managed Medicaid. If patient returns call, please reach out to [employer], Therapist, sports.

## 2022-10-25 DIAGNOSIS — H52223 Regular astigmatism, bilateral: Secondary | ICD-10-CM | POA: Diagnosis not present

## 2022-10-25 DIAGNOSIS — S161XXA Strain of muscle, fascia and tendon at neck level, initial encounter: Secondary | ICD-10-CM | POA: Diagnosis not present

## 2022-10-25 DIAGNOSIS — M62838 Other muscle spasm: Secondary | ICD-10-CM | POA: Diagnosis not present

## 2023-01-29 DIAGNOSIS — R300 Dysuria: Secondary | ICD-10-CM | POA: Diagnosis not present

## 2023-01-29 DIAGNOSIS — N760 Acute vaginitis: Secondary | ICD-10-CM | POA: Diagnosis not present

## 2023-01-29 DIAGNOSIS — N390 Urinary tract infection, site not specified: Secondary | ICD-10-CM | POA: Diagnosis not present

## 2023-01-29 DIAGNOSIS — N898 Other specified noninflammatory disorders of vagina: Secondary | ICD-10-CM | POA: Diagnosis not present

## 2023-03-03 ENCOUNTER — Other Ambulatory Visit: Payer: Self-pay

## 2023-03-03 ENCOUNTER — Emergency Department
Admission: EM | Admit: 2023-03-03 | Discharge: 2023-03-03 | Disposition: A | Discharge status: Home / Self Care | Payer: Medicaid Other | Attending: Emergency Medicine | Admitting: Emergency Medicine

## 2023-03-03 ENCOUNTER — Emergency Department: Payer: Medicaid Other

## 2023-03-03 DIAGNOSIS — E1165 Type 2 diabetes mellitus with hyperglycemia: Secondary | ICD-10-CM | POA: Diagnosis not present

## 2023-03-03 DIAGNOSIS — N938 Other specified abnormal uterine and vaginal bleeding: Secondary | ICD-10-CM | POA: Diagnosis not present

## 2023-03-03 DIAGNOSIS — N83202 Unspecified ovarian cyst, left side: Secondary | ICD-10-CM | POA: Insufficient documentation

## 2023-03-03 DIAGNOSIS — Z91148 Patient's other noncompliance with medication regimen for other reason: Secondary | ICD-10-CM | POA: Diagnosis not present

## 2023-03-03 DIAGNOSIS — N939 Abnormal uterine and vaginal bleeding, unspecified: Secondary | ICD-10-CM | POA: Diagnosis not present

## 2023-03-03 DIAGNOSIS — N83201 Unspecified ovarian cyst, right side: Secondary | ICD-10-CM | POA: Insufficient documentation

## 2023-03-03 LAB — CBC
HCT: 40.5 % (ref 36.0–46.0)
Hemoglobin: 13.3 g/dL (ref 12.0–15.0)
MCH: 28.2 pg (ref 26.0–34.0)
MCHC: 32.8 g/dL (ref 30.0–36.0)
MCV: 86 fL (ref 80.0–100.0)
Platelets: 289 10*3/uL (ref 150–400)
RBC: 4.71 MIL/uL (ref 3.87–5.11)
RDW: 14.2 % (ref 11.5–15.5)
WBC: 9.3 10*3/uL (ref 4.0–10.5)
nRBC: 0 % (ref 0.0–0.2)

## 2023-03-03 LAB — BASIC METABOLIC PANEL
Anion gap: 8 (ref 5–15)
BUN: 18 mg/dL (ref 6–20)
CO2: 23 mmol/L (ref 22–32)
Calcium: 8.7 mg/dL — ABNORMAL LOW (ref 8.9–10.3)
Chloride: 103 mmol/L (ref 98–111)
Creatinine, Ser: 0.59 mg/dL (ref 0.44–1.00)
GFR, Estimated: >60 mL/min (ref >60)
Glucose, Bld: 335 mg/dL — ABNORMAL HIGH (ref 70–99)
Potassium: 3.8 mmol/L (ref 3.5–5.1)
Sodium: 134 mmol/L — ABNORMAL LOW (ref 135–145)

## 2023-03-03 LAB — POC URINE PREG, ED: Preg Test, Ur: NEGATIVE

## 2023-03-03 MED ORDER — METFORMIN HCL 500 MG PO TABS: 500.0000 mg | ORAL_TABLET | Freq: Two times a day (BID) | ORAL | 2 refills | Status: DC

## 2023-03-03 NOTE — Discharge Instructions (Addendum)
Call and make an appointment with [facility] gynecology department.  You may take Tylenol or ibuprofen as needed for cramping.  If any severe worsening of your symptoms return to the emergency department.  Metformin 500 mg twice daily with food was sent to the pharmacy for you to restart for your diabetes.

## 2023-03-03 NOTE — ED Provider Notes (Signed)
[facility] Provider Note    Event Date/Time   First MD Initiated Contact with Patient 03/03/23 1118     (approximate)   History   Vaginal Bleeding   HPI Video Spanish interpreter used. Samantha Bonilla is a 42 y.o. female   presents to the ED with complaint of vaginal bleeding intermittently since her birth control pills were changed on February 19.  Patient is unaware of what pill she is taking currently she was placed on these to help control her menses.  Patient has had a tubal ligation.      Physical Exam   Triage Vital Signs: ED Triage Vitals  Enc Vitals Group     BP 03/03/23 1058 133/76     Pulse Rate 03/03/23 1058 88     Resp 03/03/23 1058 16     Temp 03/03/23 1058 98.3 F (36.8 C)     Temp Source 03/03/23 1111 Oral     SpO2 03/03/23 1058 100 %     Weight 03/03/23 1058 178 lb (80.7 kg)     Height 03/03/23 1058 5\' 4"  (1.626 m)     Head Circumference --      Peak Flow --      Pain Score 03/03/23 1058 4     Pain Loc --      Pain Edu? --      Excl. in [place]? --     Most recent vital signs: Vitals:   03/03/23 1058 03/03/23 1111  BP: 133/76 123/76  Pulse: 88 91  Resp: 16 16  Temp: 98.3 F (36.8 C) 98.4 F (36.9 C)  SpO2: 100% 100%     General: Awake, no distress.  CV:  Good peripheral perfusion.  Resp:  Normal effort.  Abd:  No distention.  Other:     ED Results / Procedures / Treatments   Labs (all labs ordered are listed, but only abnormal results are displayed) Labs Reviewed  BASIC METABOLIC PANEL - Abnormal; Notable for the following components:      Result Value   Sodium 134 (*)    Glucose, Bld 335 (*)    Calcium 8.7 (*)    All other components within normal limits  CBC  POC URINE PREG, ED      RADIOLOGY Pelvic ultrasound non-OB per radiologist is negative for adnexal masses but does show a simple cyst on the right ovary and a more complex cyst on the left which appears to be a hemorrhagic cyst in  nature.    PROCEDURES:  Critical Care performed:   Procedures   MEDICATIONS ORDERED IN ED: Medications - No data to display   IMPRESSION / MDM / [place] / ED COURSE  I reviewed the triage vital signs and the nursing notes.   Differential diagnosis includes, but is not limited to, dysfunctional uterine bleeding, uterine fibroid, adnexal mass, ovarian cyst.  42 year old female presents to the ED for vaginal bleeding that has been intermittent since February 19.  Patient has not seen anyone locally but apparently was placed on birth control pills to help control her menses.  She was on the impression that if she came to the emergency department that she would have a hysterectomy but has not seen a gynecologist in this area.  Lab work was reassuring and hemoglobin and hematocrit are stable.  Vital signs are normal with blood pressure of 123/76 pulse 91 and O2 sat 100%.  Patient is ambulatory multiple times in the emergency  department without any symptoms.  With use of the video Spanish interpreter she was made aware of her ultrasound results.  She and daughter understand that she has been given a referral to a gynecologist in this area for evaluation.  They will call and make an appointment.      Patient's presentation is most consistent with acute complicated illness / injury requiring diagnostic workup.  FINAL CLINICAL IMPRESSION(S) / ED DIAGNOSES   Final diagnoses:  Dysfunctional uterine bleeding  Cysts of both ovaries  Uncontrolled type 2 diabetes mellitus with hyperglycemia (HCC)  Non compliance w medication regimen     Rx / DC Orders   ED Discharge Orders          Ordered    metFORMIN (GLUCOPHAGE) 500 MG tablet  2 times daily with meals        03/03/23 1538             Note:  This document was prepared using Dragon voice recognition software and may include unintentional dictation errors.   Johnn Hai, PA-C 03/03/23 Marlana Latus    Lavonia Drafts, MD 03/03/23 [phone]

## 2023-03-03 NOTE — ED Triage Notes (Signed)
Pt to ED POV for vaginal bleeding intermittently, states recently changed birth control. States goes through 6 pads a day.

## 2023-03-05 ENCOUNTER — Telehealth: Payer: Self-pay | Admitting: Obstetrics and Gynecology

## 2023-03-05 NOTE — Telephone Encounter (Signed)
I contacted the patient x2 her voicemail wasn't set up. I was unable to let the patient know the time change for her future appointment with [facility] on 3/21 was originally at 11:15 am. Per Provider to move her appointment to 1:55 pm due to scheduling adjustment. Willette Pa A was going to interpreter for the call.

## 2023-03-05 NOTE — Telephone Encounter (Signed)
I contacted the patient x2 using pacific interpreters Christy Sartorius ID [phone]. We attempt to reach the patient two via phone. Voicemail is not set up/ no answer to advise the patient of the scheduling changes.

## 2023-03-05 NOTE — Progress Notes (Unsigned)
    [facility], [facility] [facility]   No chief complaint on file.   HPI:      [name] is a 42 y.o. No obstetric history on file. whose LMP was No LMP recorded. (Menstrual status: Other)., presents today for NP eval of BTB o OCPs, went to ED    There are no problems to display for this patient.   Past Surgical History:  Procedure Laterality Date   TUBAL LIGATION      No family history on file.  Social History   Socioeconomic History   Marital status: Married    Spouse name: Not on file   Number of children: Not on file   Years of education: Not on file   Highest education level: Not on file  Occupational History   Not on file  Tobacco Use   Smoking status: Never   Smokeless tobacco: Never  Substance and Sexual Activity   Alcohol use: Not Currently   Drug use: Not on file   Sexual activity: Not on file  Other Topics Concern   Not on file  Social History Narrative   Not on file   Social Determinants of Health   Financial Resource Strain: Not on file  Food Insecurity: Not on file  Transportation Needs: Not on file  Physical Activity: Not on file  Stress: Not on file  Social Connections: Not on file  Intimate Partner Violence: Not on file    Outpatient Medications Prior to Visit  Medication Sig Dispense Refill   metFORMIN (GLUCOPHAGE) 500 MG tablet Take 1 tablet (500 mg total) by mouth 2 (two) times daily with a meal. 60 tablet 2   No facility-administered medications prior to visit.      ROS:  Review of Systems BREAST: No symptoms   OBJECTIVE:   Vitals:  There were no vitals taken for this visit.  Physical Exam  Results: No results found for this or any previous visit (from the past 24 hour(s)).   Assessment/Plan: No diagnosis found.    No orders of the defined types were placed in this encounter.     No follow-ups on file.  Ramzi Brathwaite B. [name], PA-C 03/05/2023 5:55 PM

## 2023-03-06 ENCOUNTER — Other Ambulatory Visit (HOSPITAL_COMMUNITY)
Admission: RE | Admit: 2023-03-06 | Discharge: 2023-03-06 | Disposition: A | Discharge status: Home / Self Care | Payer: Medicaid Other | Source: Ambulatory Visit | Attending: Obstetrics and Gynecology | Admitting: Obstetrics and Gynecology

## 2023-03-06 ENCOUNTER — Encounter: Payer: Self-pay | Admitting: Obstetrics and Gynecology

## 2023-03-06 ENCOUNTER — Encounter: Payer: Medicaid Other | Admitting: Obstetrics and Gynecology

## 2023-03-06 ENCOUNTER — Ambulatory Visit (INDEPENDENT_AMBULATORY_CARE_PROVIDER_SITE_OTHER): Payer: Medicaid Other | Admitting: Obstetrics and Gynecology

## 2023-03-06 VITALS — BP 120/70 | Ht 64.0 in | Wt 175.0 lb

## 2023-03-06 DIAGNOSIS — N841 Polyp of cervix uteri: Secondary | ICD-10-CM | POA: Insufficient documentation

## 2023-03-06 DIAGNOSIS — Z124 Encounter for screening for malignant neoplasm of cervix: Secondary | ICD-10-CM | POA: Diagnosis not present

## 2023-03-06 DIAGNOSIS — N898 Other specified noninflammatory disorders of vagina: Secondary | ICD-10-CM | POA: Diagnosis not present

## 2023-03-06 DIAGNOSIS — N83201 Unspecified ovarian cyst, right side: Secondary | ICD-10-CM

## 2023-03-06 DIAGNOSIS — N920 Excessive and frequent menstruation with regular cycle: Secondary | ICD-10-CM | POA: Diagnosis not present

## 2023-03-06 DIAGNOSIS — Z1151 Encounter for screening for human papillomavirus (HPV): Secondary | ICD-10-CM

## 2023-03-06 DIAGNOSIS — N83202 Unspecified ovarian cyst, left side: Secondary | ICD-10-CM | POA: Diagnosis not present

## 2023-03-06 DIAGNOSIS — Z30011 Encounter for initial prescription of contraceptive pills: Secondary | ICD-10-CM

## 2023-03-06 LAB — POCT WET PREP WITH KOH
Clue Cells Wet Prep HPF POC: NEGATIVE
KOH Prep POC: NEGATIVE
Trichomonas, UA: NEGATIVE
Yeast Wet Prep HPF POC: NEGATIVE

## 2023-03-06 MED ORDER — FLUCONAZOLE 150 MG PO TABS: 150.0000 mg | ORAL_TABLET | Freq: Once | ORAL | 0 refills | Status: DC

## 2023-03-06 MED ORDER — NORETHINDRONE 0.35 MG PO TABS: 1.0000 | ORAL_TABLET | Freq: Every day | ORAL | 0 refills | Status: DC

## 2023-03-06 NOTE — Patient Instructions (Signed)
I value your feedback and you entrusting us with your care. If you get a [facility] patient survey, I would appreciate you taking the time to let us know about your experience today. Thank you! ? ? ?

## 2023-03-07 LAB — TSH+FREE T4
Free T4: 1.22 ng/dL (ref 0.82–1.77)
TSH: 1.42 u[IU]/mL (ref 0.450–4.500)

## 2023-03-09 NOTE — Progress Notes (Signed)
Pls let pt know thyroid labs normal. Will see what cycles do with hormones. Needs spanish

## 2023-03-10 LAB — CYTOLOGY - PAP
Comment: NEGATIVE
Diagnosis: NEGATIVE
High risk HPV: NEGATIVE

## 2023-03-10 LAB — SURGICAL PATHOLOGY

## 2023-04-14 NOTE — Progress Notes (Unsigned)
New patient visit   Patient: Samantha Bonilla   DOB: [birth year]   42 y.o. Female  MRN: [id number] Visit Date: 04/16/2023  Today's healthcare provider: Sherlyn Hay, DO   No chief complaint on file.  Subjective    Samantha Bonilla is a 42 y.o. female who presents today as a new patient to establish care.  Samantha Bonilla, interpreter.  HPI  Patient is a pleasant, Spanish-speaking, 42 year old female who presents today to establish care.  She moved here from [place] 2 years ago and has had difficulty accessing care due to insurance issues related to her job.  DM: Has not been taking metformin. She says it had been causing significant headaches. She took it for approximately three weeks before stopping due to the headaches. It has been about a week since she stopped. She was told she had pre-diabetes in 2016. Her ER visit in March 2023 was the first time she was made aware she has diabetes. She has not been checking her blood sugar.  She did have a eye exam in January 2024.  She denies polydipsia, stating she only drinks about three bottles of water a day, and also denies polyuria and polyphagia, stating she does not feel she is peeing any more than normal or that her appetite has changed.. She endorses getting headaches usually once a week.  She notes that, while working, sometimes the heat seems to bring them on. 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  Nasal drainage and congestion that occurs mostly at night.  Birth control is helping with her heavy bleeding and pain.  Regarding her dizziness, her most recent episode was 4 days ago.  She felt her head was spinning and she had to lay down. This occurred while she was folding clothes. She had eaten and had fluids prior.  She denies any chest pain, sweating, concurrent arm pain and nausea.  She has pain in her left arm but thinks it's related to her having to lift  wood at work. The pain is worse when she tries to lift something that's too heavy.  She notes that she has a small, hard lump on nose and would like something for it.  Patient also would like the rash at the side of her lips checked.  Past Medical History:  Diagnosis Date   Acute pain of left wrist 06/04/2022   Allergy    Diabetes mellitus without complication [facility])    Past Surgical History:  Procedure Laterality Date   TUBAL LIGATION     WISDOM TOOTH EXTRACTION     No family status information on file.   No family history on file. Social History   Socioeconomic History   Marital status: Married    Spouse name: Not on file   Number of children: Not on file   Years of education: Not on file   Highest education level: Not on file  Occupational History   Not on file  Tobacco Use   Smoking status: Never   Smokeless tobacco: Never  Vaping Use   Vaping Use: Never used  Substance and Sexual Activity   Alcohol use: Not Currently   Drug use: Not Currently   Sexual activity: Yes    Birth control/protection: Surgical    Comment: Tubal Ligation  Other Topics Concern   Not on file  Social History Narrative   Not on file   Social Determinants of Health   Financial Resource  Strain: Not on file  Food Insecurity: Not on file  Transportation Needs: Not on file  Physical Activity: Not on file  Stress: Not on file  Social Connections: Not on file   Outpatient Medications Prior to Visit  Medication Sig   norethindrone (MICRONOR) 0.35 MG tablet Take 1 tablet (0.35 mg total) by mouth daily.   metFORMIN (GLUCOPHAGE) 500 MG tablet Take 1 tablet (500 mg total) by mouth 2 (two) times daily with a meal. (Patient not taking: Reported on 04/16/2023)   No facility-administered medications prior to visit.   Allergies  Allergen Reactions   Penicillins Rash    Immunization History  Administered Date(s) Administered   Tdap 09/07/2021    Health Maintenance  Topic Date Due    COVID-19 Vaccine (1) Never done   HIV Screening  Never done   Diabetic kidney evaluation - Urine ACR  Never done   Hepatitis C Screening  Never done   HEMOGLOBIN A1C  05/01/2021   INFLUENZA VACCINE  07/17/2023   OPHTHALMOLOGY EXAM  12/24/2023   Diabetic kidney evaluation - eGFR measurement  03/02/2024   FOOT EXAM  04/15/2024   PAP SMEAR-Modifier  03/05/2028   DTaP/Tdap/Td (2 - Td or Tdap) 09/08/2031   HPV VACCINES  Aged Out    Patient Care Team: Rielynn Trulson, Monico Blitz, DO as PCP - General (Family Medicine)  Review of Systems  Constitutional:  Positive for fatigue. Negative for appetite change.  Eyes:  Negative for visual disturbance.  Respiratory:  Negative for shortness of breath.   Cardiovascular:  Negative for chest pain.  Gastrointestinal:  Negative for abdominal pain, constipation and diarrhea.  Endocrine: Negative for polydipsia, polyphagia and polyuria.  Skin:  Positive for rash (with itching on face).  Neurological:  Positive for dizziness and headaches.       Headaches occur approximately once per week.       Objective    BP 123/84 (BP Location: Right Arm, Patient Position: Sitting, Cuff Size: Normal)   Pulse 80   Temp 98.2 F (36.8 C) (Oral)   Ht 5\' 5"  (1.651 m)   Wt 175 lb (79.4 kg)   SpO2 100%   BMI 29.12 kg/m    Physical Exam Constitutional:      General: She is not in acute distress.    Appearance: Normal appearance. She is not ill-appearing.  HENT:     Head: Normocephalic and atraumatic.  Eyes:     Extraocular Movements: Extraocular movements intact.     Conjunctiva/sclera: Conjunctivae normal.  Cardiovascular:     Rate and Rhythm: Normal rate and regular rhythm.     Pulses: Normal pulses.     Heart sounds: Normal heart sounds.  Pulmonary:     Effort: Pulmonary effort is normal.     Breath sounds: Normal breath sounds.  Abdominal:     General: Bowel sounds are normal.     Palpations: Abdomen is soft.  Musculoskeletal:        General: No  deformity.     Cervical back: Normal range of motion and neck supple.  Skin:    General: Skin is warm and dry.     Findings: Rash present. No erythema.          Comments: Small, skin-colored, hard lump, approx. 3-32mm diameter, located approx. 1cm proximal to tip of her nose, just to the left of midline. She has two small rashes with slight redness and mildly flaky skin which are both approximately 1 to 1.5 cm in diameter  ovals, located at the outer edges of her mouth.   Neurological:     General: No focal deficit present.     Mental Status: She is alert and oriented to person, place, and time.  Psychiatric:        Mood and Affect: Mood normal.        Behavior: Behavior normal.     Depression Screen    04/16/2023    3:49 PM  PHQ 2/9 Scores  PHQ - 2 Score 0  PHQ- 9 Score 0   No results found for any visits on 04/16/23.  Assessment & Plan      Problem List Items Addressed This Visit     Uncontrolled type 2 diabetes mellitus with hyperglycemia (HCC) - Primary    Discussed with patient the importance of keeping her blood sugar within normal limits and counseled her to check her blood sugar and log it on a piece of paper every morning before breakfast.  Will give her patient information in the AVS in Spanish, which she states she is able to read.  Plan to continue counseling regarding diabetes and what it entails in future visits.  Will check blood work today, including an A1c and lipid panel.  Will prescribe Januvia as noted below.      Relevant Medications   Blood Glucose Monitoring Suppl DEVI   Glucose Blood (BLOOD GLUCOSE TEST STRIPS) STRP   Lancet Device MISC   Lancets Misc. MISC   sitaGLIPtin (JANUVIA) 100 MG tablet   Other Relevant Orders   Comprehensive metabolic panel   HgB A1c   Lipid Profile   Urine microalbumin-creatinine with uACR   Angular cheilitis    I suspect the patient's rash is angular cheilitis.  Will check a B2 and B12 level today.  Will prescribe  hydrocortisone ointment as noted below to help alleviate the patient's symptoms.      Relevant Medications   hydrocortisone 1 % ointment   Other Relevant Orders   Vitamin B2, Whole Blood   Vitamin B12   Other Visit Diagnoses     Encounter for screening for HIV       Relevant Orders   HIV antibody (with reflex)   Encounter for HCV screening test for low risk patient       Relevant Orders   Hepatitis C antibody   BMI 29.0-29.9,adult       Fibrous papule of nose           BMI 29.0-29.9,adult Discussed with patient the possibility of adding Ozempic for her diabetes with the added benefit of weight loss if her blood work shows that she has hyperlipidemia.  Counseled patient regarding weight loss and will continue to do so at future visits.   Fibrous papule of nose Not addressed at this visit; may consider/discuss cryotherapy or shave biopsy at future visit due to patient preference for it to be gone, given its location.    Return in about 4 weeks (around 05/14/2023).     The entirety of the information documented in the History of Present Illness, Review of Systems and Physical Exam were personally obtained by me. Portions of this information were initially documented by the CMA, Adline Peals, and reviewed by me for thoroughness and accuracy.     Sherlyn Hay, DO  [facility] [phone] (phone) [phone] (fax)  [facility]

## 2023-04-16 ENCOUNTER — Encounter: Payer: Self-pay | Admitting: Family Medicine

## 2023-04-16 ENCOUNTER — Ambulatory Visit (INDEPENDENT_AMBULATORY_CARE_PROVIDER_SITE_OTHER): Payer: Medicaid Other | Admitting: Family Medicine

## 2023-04-16 VITALS — BP 123/84 | HR 80 | Temp 98.2°F | Ht 65.0 in | Wt 175.0 lb

## 2023-04-16 DIAGNOSIS — Z1159 Encounter for screening for other viral diseases: Secondary | ICD-10-CM

## 2023-04-16 DIAGNOSIS — D2239 Melanocytic nevi of other parts of face: Secondary | ICD-10-CM | POA: Diagnosis not present

## 2023-04-16 DIAGNOSIS — E1165 Type 2 diabetes mellitus with hyperglycemia: Secondary | ICD-10-CM | POA: Diagnosis not present

## 2023-04-16 DIAGNOSIS — Z114 Encounter for screening for human immunodeficiency virus [HIV]: Secondary | ICD-10-CM

## 2023-04-16 DIAGNOSIS — Z794 Long term (current) use of insulin: Secondary | ICD-10-CM | POA: Diagnosis not present

## 2023-04-16 DIAGNOSIS — Z6829 Body mass index (BMI) 29.0-29.9, adult: Secondary | ICD-10-CM

## 2023-04-16 DIAGNOSIS — K130 Diseases of lips: Secondary | ICD-10-CM | POA: Diagnosis not present

## 2023-04-16 MED ORDER — LANCET DEVICE MISC
1.0000 | Freq: Three times a day (TID) | 0 refills | Status: AC
Start: 2023-04-16 — End: 2023-05-16

## 2023-04-16 MED ORDER — BLOOD GLUCOSE MONITORING SUPPL DEVI
1.0000 | Freq: Three times a day (TID) | 0 refills | Status: AC
Start: 2023-04-16 — End: ?

## 2023-04-16 MED ORDER — SITAGLIPTIN PHOSPHATE 100 MG PO TABS
100.0000 mg | ORAL_TABLET | Freq: Every day | ORAL | 0 refills | Status: DC
Start: 2023-04-16 — End: 2023-05-14

## 2023-04-16 MED ORDER — LANCETS MISC. MISC
1.0000 | Freq: Every day | 0 refills | Status: AC
Start: 2023-04-16 — End: 2023-05-16

## 2023-04-16 MED ORDER — BLOOD GLUCOSE TEST VI STRP
1.0000 | ORAL_STRIP | Freq: Every day | 2 refills | Status: DC
Start: 2023-04-16 — End: 2023-05-14

## 2023-04-17 ENCOUNTER — Encounter: Payer: Self-pay | Admitting: Family Medicine

## 2023-04-17 DIAGNOSIS — K130 Diseases of lips: Secondary | ICD-10-CM | POA: Insufficient documentation

## 2023-04-17 MED ORDER — HYDROCORTISONE 1 % EX OINT
1.0000 | TOPICAL_OINTMENT | Freq: Two times a day (BID) | CUTANEOUS | 1 refills | Status: AC
Start: 2023-04-17 — End: 2023-05-01

## 2023-04-17 NOTE — Assessment & Plan Note (Addendum)
I suspect the patient's rash is angular cheilitis.  Will check a B2 and B12 level today.  Will prescribe hydrocortisone ointment as noted below to help alleviate the patient's symptoms.

## 2023-04-17 NOTE — Assessment & Plan Note (Addendum)
Discussed with patient the importance of keeping her blood sugar within normal limits and counseled her to check her blood sugar and log it on a piece of paper every morning before breakfast.  Will give her patient information in the AVS in Spanish, which she states she is able to read.  Plan to continue counseling regarding diabetes and what it entails in future visits.  Will check blood work today, including an A1c and lipid panel.  Will prescribe Januvia as noted below.

## 2023-04-18 DIAGNOSIS — Z114 Encounter for screening for human immunodeficiency virus [HIV]: Secondary | ICD-10-CM | POA: Diagnosis not present

## 2023-04-18 DIAGNOSIS — Z1159 Encounter for screening for other viral diseases: Secondary | ICD-10-CM | POA: Diagnosis not present

## 2023-04-18 DIAGNOSIS — E1165 Type 2 diabetes mellitus with hyperglycemia: Secondary | ICD-10-CM | POA: Diagnosis not present

## 2023-04-18 DIAGNOSIS — K130 Diseases of lips: Secondary | ICD-10-CM | POA: Diagnosis not present

## 2023-04-19 LAB — MICROALBUMIN / CREATININE URINE RATIO: Microalbumin, Urine: <3 ug/mL

## 2023-04-19 LAB — COMPREHENSIVE METABOLIC PANEL
ALT: 24 IU/L (ref 0–32)
AST: 16 IU/L (ref 0–40)
Albumin/Globulin Ratio: 1.9 (ref 1.2–2.2)
Albumin: 4.1 g/dL (ref 3.9–4.9)
BUN/Creatinine Ratio: 25 — ABNORMAL HIGH (ref 9–23)
BUN: 17 mg/dL (ref 6–24)
Bilirubin Total: 0.5 mg/dL (ref 0.0–1.2)
Calcium: 8.8 mg/dL (ref 8.7–10.2)
Creatinine, Ser: 0.68 mg/dL (ref 0.57–1.00)
Sodium: 136 mmol/L (ref 134–144)
Total Protein: 6.3 g/dL (ref 6.0–8.5)
eGFR: 111 mL/min/{1.73_m2} (ref >59)

## 2023-04-19 LAB — LIPID PANEL
Chol/HDL Ratio: 2.7 ratio (ref 0.0–4.4)
HDL: 53 mg/dL (ref >39)
LDL Chol Calc (NIH): 75 mg/dL (ref 0–99)
Triglycerides: 78 mg/dL (ref 0–149)

## 2023-04-19 LAB — VITAMIN B2, WHOLE BLOOD

## 2023-04-19 LAB — HEMOGLOBIN A1C
Est. average glucose Bld gHb Est-mCnc: 266 mg/dL
Hgb A1c MFr Bld: 10.9 % — ABNORMAL HIGH (ref 4.8–5.6)

## 2023-04-22 ENCOUNTER — Telehealth: Payer: Self-pay | Admitting: Family Medicine

## 2023-04-22 NOTE — Telephone Encounter (Signed)
Brittini Haymer (KeyEliseo Gum Januvia 100MG  tablets Status: PA RequestCreated: May 6th, 2024 [id number] Sent: May 7th, 2024

## 2023-04-22 NOTE — Progress Notes (Signed)
(  Needs Spanish interpreter) Please call the patient to let her know her HIV and hepatitis C screening tests were negative, her urine test was reassuring for her current kidney health and her cholesterol levels are currently. However, her blood tests show her sugars have been quite high, so it is important she take the medication she was prescribed every day (and let us know if she has problems that cause her to consider not taking it).  As discussed, she also needs to be checking her morning blood sugar before eating and writing the levels down, then bring that record with her when she comes to her next appointment.   The 10-year ASCVD risk score (Arnett DK, et al., 2019) is: 0.7%   Values used to calculate the score:     Age: 42 years     Sex: Female     Is Non-Hispanic African American: No     Diabetic: Yes     Tobacco smoker: No     Systolic Blood Pressure: 123 mmHg     Is BP treated: No     HDL Cholesterol: 53 mg/dL     Total Cholesterol: 143 mg/dL

## 2023-04-22 NOTE — Telephone Encounter (Signed)
CoverMyMeds is requesting prior authorization Key: [id number] Name: Salvi Januvia 100mg  tablets has been rejected and requires PA

## 2023-04-23 ENCOUNTER — Encounter: Payer: Self-pay | Admitting: Emergency Medicine

## 2023-04-23 ENCOUNTER — Other Ambulatory Visit: Payer: Self-pay

## 2023-04-23 ENCOUNTER — Observation Stay
Admission: EM | Admit: 2023-04-23 | Discharge: 2023-04-24 | Disposition: A | Discharge status: Home / Self Care | Payer: Medicaid Other | Attending: Student | Admitting: Student

## 2023-04-23 DIAGNOSIS — T782XXA Anaphylactic shock, unspecified, initial encounter: Secondary | ICD-10-CM | POA: Diagnosis not present

## 2023-04-23 DIAGNOSIS — R220 Localized swelling, mass and lump, head: Secondary | ICD-10-CM | POA: Diagnosis not present

## 2023-04-23 DIAGNOSIS — E119 Type 2 diabetes mellitus without complications: Secondary | ICD-10-CM | POA: Diagnosis not present

## 2023-04-23 DIAGNOSIS — T7809XA Anaphylactic reaction due to other food products, initial encounter: Principal | ICD-10-CM | POA: Insufficient documentation

## 2023-04-23 DIAGNOSIS — T781XXA Other adverse food reactions, not elsewhere classified, initial encounter: Secondary | ICD-10-CM | POA: Diagnosis present

## 2023-04-23 DIAGNOSIS — R000 Tachycardia, unspecified: Secondary | ICD-10-CM | POA: Diagnosis not present

## 2023-04-23 DIAGNOSIS — E876 Hypokalemia: Secondary | ICD-10-CM | POA: Insufficient documentation

## 2023-04-23 DIAGNOSIS — E1165 Type 2 diabetes mellitus with hyperglycemia: Secondary | ICD-10-CM

## 2023-04-23 LAB — CBC WITH DIFFERENTIAL/PLATELET
Abs Immature Granulocytes: 0.06 10*3/uL (ref 0.00–0.07)
Basophils Absolute: 0.1 10*3/uL (ref 0.0–0.1)
Basophils Relative: 1 %
Eosinophils Absolute: 0.4 10*3/uL (ref 0.0–0.5)
Eosinophils Relative: 2 %
HCT: 39.1 % (ref 36.0–46.0)
Hemoglobin: 13.3 g/dL (ref 12.0–15.0)
Immature Granulocytes: 0 %
Lymphocytes Relative: 54 %
Lymphs Abs: 10.4 10*3/uL — ABNORMAL HIGH (ref 0.7–4.0)
MCH: 29.6 pg (ref 26.0–34.0)
MCHC: 34 g/dL (ref 30.0–36.0)
MCV: 87.1 fL (ref 80.0–100.0)
Monocytes Absolute: 0.8 10*3/uL (ref 0.1–1.0)
Monocytes Relative: 4 %
Neutro Abs: 7.4 10*3/uL (ref 1.7–7.7)
Neutrophils Relative %: 39 %
Platelets: 373 10*3/uL (ref 150–400)
RBC: 4.49 MIL/uL (ref 3.87–5.11)
RDW: 13.9 % (ref 11.5–15.5)
Smear Review: NORMAL
WBC Morphology: ABNORMAL
WBC: 19.1 10*3/uL — ABNORMAL HIGH (ref 4.0–10.5)
nRBC: 0 % (ref 0.0–0.2)

## 2023-04-23 LAB — COMPREHENSIVE METABOLIC PANEL
ALT: 30 U/L (ref 0–44)
AST: 32 U/L (ref 15–41)
Albumin: 3.6 g/dL (ref 3.5–5.0)
Alkaline Phosphatase: 94 U/L (ref 38–126)
Anion gap: 15 (ref 5–15)
BUN: 21 mg/dL — ABNORMAL HIGH (ref 6–20)
CO2: 17 mmol/L — ABNORMAL LOW (ref 22–32)
Calcium: 8.4 mg/dL — ABNORMAL LOW (ref 8.9–10.3)
Chloride: 101 mmol/L (ref 98–111)
Creatinine, Ser: 0.76 mg/dL (ref 0.44–1.00)
Glucose, Bld: 411 mg/dL — ABNORMAL HIGH (ref 70–99)
Potassium: 2.3 mmol/L — CL (ref 3.5–5.1)
Sodium: 133 mmol/L — ABNORMAL LOW (ref 135–145)
Total Bilirubin: 0.7 mg/dL (ref 0.3–1.2)
Total Protein: 6.6 g/dL (ref 6.5–8.1)

## 2023-04-23 LAB — MAGNESIUM: Magnesium: 1.8 mg/dL (ref 1.7–2.4)

## 2023-04-23 LAB — HCG, QUANTITATIVE, PREGNANCY: hCG, Beta Chain, Quant, S: <1 m[IU]/mL (ref <5)

## 2023-04-23 MED ORDER — LORATADINE 10 MG PO TABS
10.0000 mg | ORAL_TABLET | Freq: Every day | ORAL | Status: DC
  Administered 2023-04-23 – 2023-04-24 (×2): 10 mg via ORAL
  Filled 2023-04-23 (×2): qty 1

## 2023-04-23 MED ORDER — PANTOPRAZOLE SODIUM 40 MG IV SOLR
40.0000 mg | Freq: Two times a day (BID) | INTRAVENOUS | Status: DC
  Administered 2023-04-23 – 2023-04-24 (×2): 40 mg via INTRAVENOUS
  Filled 2023-04-23 (×2): qty 10

## 2023-04-23 MED ORDER — EPINEPHRINE 0.3 MG/0.3ML IJ SOAJ: 0.3000 mg | Freq: Once | INTRAMUSCULAR | Status: DC | PRN

## 2023-04-23 MED ORDER — EPINEPHRINE 0.3 MG/0.3ML IJ SOAJ
0.3000 mg | Freq: Once | INTRAMUSCULAR | Status: AC
  Administered 2023-04-23: 0.3 mg via INTRAMUSCULAR

## 2023-04-23 MED ORDER — HEPARIN SODIUM (PORCINE) 5000 UNIT/ML IJ SOLN
5000.0000 [IU] | Freq: Three times a day (TID) | INTRAMUSCULAR | Status: DC
  Administered 2023-04-23 – 2023-04-24 (×3): 5000 [IU] via SUBCUTANEOUS
  Filled 2023-04-23 (×3): qty 1

## 2023-04-23 MED ORDER — SODIUM CHLORIDE 0.9% FLUSH
3.0000 mL | Freq: Two times a day (BID) | INTRAVENOUS | Status: DC
  Administered 2023-04-23 – 2023-04-24 (×2): 3 mL via INTRAVENOUS

## 2023-04-23 MED ORDER — FAMOTIDINE IN NACL 20-0.9 MG/50ML-% IV SOLN
20.0000 mg | Freq: Two times a day (BID) | INTRAVENOUS | Status: DC
  Administered 2023-04-23 – 2023-04-24 (×2): 20 mg via INTRAVENOUS
  Filled 2023-04-23 (×2): qty 50

## 2023-04-23 MED ORDER — FAMOTIDINE IN NACL 20-0.9 MG/50ML-% IV SOLN
20.0000 mg | Freq: Once | INTRAVENOUS | Status: AC
  Administered 2023-04-23: 20 mg via INTRAVENOUS

## 2023-04-23 MED ORDER — METHYLPREDNISOLONE SODIUM SUCC 125 MG IJ SOLR
80.0000 mg | Freq: Two times a day (BID) | INTRAMUSCULAR | Status: DC
  Administered 2023-04-23 – 2023-04-24 (×2): 80 mg via INTRAVENOUS
  Filled 2023-04-23 (×2): qty 2

## 2023-04-23 MED ORDER — POTASSIUM CHLORIDE CRYS ER 20 MEQ PO TBCR
40.0000 meq | EXTENDED_RELEASE_TABLET | Freq: Once | ORAL | Status: AC
  Administered 2023-04-23: 40 meq via ORAL
  Filled 2023-04-23: qty 2

## 2023-04-23 MED ORDER — METHYLPREDNISOLONE SODIUM SUCC 125 MG IJ SOLR
125.0000 mg | Freq: Once | INTRAMUSCULAR | Status: AC
  Administered 2023-04-23: 125 mg via INTRAVENOUS

## 2023-04-23 MED ORDER — ACETAMINOPHEN 325 MG RE SUPP: 650.0000 mg | Freq: Four times a day (QID) | RECTAL | Status: DC | PRN

## 2023-04-23 MED ORDER — DIPHENHYDRAMINE HCL 50 MG/ML IJ SOLN
50.0000 mg | Freq: Once | INTRAMUSCULAR | Status: AC
  Administered 2023-04-23: 50 mg via INTRAVENOUS

## 2023-04-23 MED ORDER — POTASSIUM CHLORIDE 10 MEQ/100ML IV SOLN
10.0000 meq | INTRAVENOUS | Status: AC
  Administered 2023-04-23 (×2): 10 meq via INTRAVENOUS
  Filled 2023-04-23 (×2): qty 100

## 2023-04-23 MED ORDER — LACTATED RINGERS IV SOLN: INTRAVENOUS | Status: DC

## 2023-04-23 MED ORDER — ACETAMINOPHEN 325 MG PO TABS: 650.0000 mg | ORAL_TABLET | Freq: Four times a day (QID) | ORAL | Status: DC | PRN

## 2023-04-23 MED ORDER — EPINEPHRINE HCL 5 MG/250ML IV SOLN IN NS
0.5000 ug/min | INTRAVENOUS | Status: DC
  Administered 2023-04-23: 10 ug/min via INTRAVENOUS

## 2023-04-23 NOTE — Telephone Encounter (Signed)
Fax received. Patient approved

## 2023-04-23 NOTE — ED Triage Notes (Signed)
Pt to ED via POV for allergic reaction. Pt family member states that she was drinking a boba drink and then started itching all over and started having swelling in her face and mouth. Pt states hat she is not able to swallow. Pt family states that her voice does not sound like normal. Pts face is red.  EDP at bedside, dose of epi administered.

## 2023-04-23 NOTE — H&P (Signed)
History and Physical     Patient: Samantha Bonilla ZOX:[id number] DOB: November 12, 1981 DOA: 04/23/2023 DOS: the patient was seen and examined on 04/24/2023 PCP: Sherlyn Hay, DO   Patient coming from: Home  Chief Complaint: Tongue swelling  HISTORY OF PRESENT ILLNESS: Samantha Bonilla is an 42 y.o. female seen in the emergency room with complaints of tongue swelling and being flushed after drinking Boba tea.  Patient has a history of allergies to penicillin and allergies during childhood but has not required to keep an EpiPen. Patient report any chest pain or vomiting shortness of breath chest pain palpitations.  Past Medical History:  Diagnosis Date   Acute pain of left wrist 06/04/2022   Allergy    Diabetes mellitus without complication (HCC)    Review of Systems  HENT:  Positive for facial swelling and trouble swallowing.   Respiratory:  Positive for shortness of breath.   All other systems reviewed and are negative.  Allergies  Allergen Reactions   Penicillins Rash   Past Surgical History:  Procedure Laterality Date   TUBAL LIGATION     WISDOM TOOTH EXTRACTION     MEDICATIONS: Prior to Admission medications   Medication Sig Start Date End Date Taking? Authorizing Provider  Blood Glucose Monitoring Suppl DEVI 1 each by Does not apply route in the morning, at noon, and at bedtime. May substitute to any manufacturer covered by patient's insurance. 04/16/23   Sherlyn Hay, DO  Glucose Blood (BLOOD GLUCOSE TEST STRIPS) STRP 1 each by In Vitro route daily before breakfast. May substitute to any manufacturer covered by patient's insurance. 04/16/23 07/15/23  Sherlyn Hay, DO  hydrocortisone 1 % ointment Apply 1 Application topically 2 (two) times daily for 14 days. Aplique una pequena cantidad al [employer] veces al dia. 04/17/23 05/01/23  Sherlyn Hay, DO  Lancet Device MISC 1 each by Does not apply route in the morning, at noon, and at bedtime. May substitute  to any manufacturer covered by patient's insurance. 04/16/23 05/16/23  Sherlyn Hay, DO  Lancets Misc. MISC 1 each by Does not apply route daily before breakfast. May substitute to any manufacturer covered by patient's insurance. 04/16/23 05/16/23  Sherlyn Hay, DO  metFORMIN (GLUCOPHAGE) 500 MG tablet Take 1 tablet (500 mg total) by mouth 2 (two) times daily with a meal. Patient not taking: Reported on 04/16/2023 03/03/23 03/02/24  Tommi Rumps, PA-C  norethindrone (MICRONOR) 0.35 MG tablet Take 1 tablet (0.35 mg total) by mouth daily. 03/06/23   Copland, Helmut Muster B, PA-C  sitaGLIPtin (JANUVIA) 100 MG tablet Take 1 tablet (100 mg total) by mouth daily. 04/16/23 05/16/23  Sherlyn Hay, DO    epinephrine Stopped (04/23/23 2024)   famotidine (PEPCID) IV Stopped (04/23/23 2323)   lactated ringers 75 mL/hr at 04/24/23 0041   ED Course: Pt in Ed was started on epinephrine drip.  It is currently off and stable swelling has reduced.  Meets SIRS criteria.  Vitals:   04/23/23 1743 04/23/23 1800 04/23/23 1830 04/24/23 0041  BP:  (!) 153/83 (!) 166/85 127/79  Pulse:  (!) 109 99 70  Resp:  (!) 27 (!) 28 (!) 24  Temp:    98.2 F (36.8 C)  TempSrc:    Oral  SpO2:  98% 100% 98%  Weight: 85.1 kg      Total I/O In: 291.6 [I.V.:41.6; IV Piggyback:250] Out: -  SpO2: 98 % Blood work in ed shows: Hyponatremia 133 hypokalemia  2.3 bicarb of 17 gap of 15 BUN 21 normal kidney function normal LFTs. In the emergency room patient received Benadryl 50 epi injection 0.3 mg intramuscularly. Patient received Pepcid Solu-Medrol, potassium replacement therapy. EKG shows:Sinus tachycardia at 107 QTc 451 PR 153 normal axis nonspecific ST depression in V3.     Results for orders placed or performed during the hospital encounter of 04/23/23 (from the past 72 hour(s))  Comprehensive metabolic panel     Status: Abnormal   Collection Time: 04/23/23  6:21 PM  Result Value Ref Range   Sodium 133 (L) 135 - 145 mmol/L    Potassium 2.3 (LL) 3.5 - 5.1 mmol/L    Comment: CRITICAL RESULT CALLED TO, READ BACK BY AND VERIFIED WITH: THERON RIRIE @1948  ON 04/23/23 SKL    Chloride 101 98 - 111 mmol/L   CO2 17 (L) 22 - 32 mmol/L   Glucose, Bld 411 (H) 70 - 99 mg/dL   BUN 21 (H) 6 - 20 mg/dL   Creatinine, Ser 1.61 0.44 - 1.00 mg/dL   Calcium 8.4 (L) 8.9 - 10.3 mg/dL   Total Protein 6.6 6.5 - 8.1 g/dL   Albumin 3.6 3.5 - 5.0 g/dL   AST 32 15 - 41 U/L   ALT 30 0 - 44 U/L   Alkaline Phosphatase 94 38 - 126 U/L   Total Bilirubin 0.7 0.3 - 1.2 mg/dL   GFR, Estimated NOT CALCULATED >60 mL/min   Anion gap 15 5 - 15    Comment: Performed at [facility], [address] [place], [place] [address]  CBC with Differential     Status: Abnormal   Collection Time: 04/23/23  6:21 PM  Result Value Ref Range   WBC 19.1 (H) 4.0 - 10.5 K/uL   RBC 4.49 3.87 - 5.11 MIL/uL   Hemoglobin 13.3 12.0 - 15.0 g/dL   HCT 54.0 98.1 - 19.1 %   MCV 87.1 80.0 - 100.0 fL   MCH 29.6 26.0 - 34.0 pg   MCHC 34.0 30.0 - 36.0 g/dL   RDW 47.8 29.5 - 62.1 %   Platelets 373 150 - 400 K/uL   nRBC 0.0 0.0 - 0.2 %   Neutrophils Relative % 39 %   Neutro Abs 7.4 1.7 - 7.7 K/uL   Lymphocytes Relative 54 %   Lymphs Abs 10.4 (H) 0.7 - 4.0 K/uL   Monocytes Relative 4 %   Monocytes Absolute 0.8 0.1 - 1.0 K/uL   Eosinophils Relative 2 %   Eosinophils Absolute 0.4 0.0 - 0.5 K/uL   Basophils Relative 1 %   Basophils Absolute 0.1 0.0 - 0.1 K/uL   WBC Morphology Abnormal lymphocytes present    RBC Morphology MORPHOLOGY UNREMARKABLE    Smear Review Normal platelet morphology    Immature Granulocytes 0 %   Abs Immature Granulocytes 0.06 0.00 - 0.07 K/uL    Comment: Performed at [facility], [address] [place], [place] [address]  Magnesium     Status: None   Collection Time: 04/23/23  6:21 PM  Result Value Ref Range   Magnesium 1.8 1.7 - 2.4 mg/dL    Comment: Performed at [facility], [address] [place],  [place] [address]  hCG, quantitative, pregnancy     Status: None   Collection Time: 04/23/23  6:28 PM  Result Value Ref Range   hCG, Beta Chain, Quant, S <1 <5 mIU/mL    Comment:          GEST.  AGE      CONC.  (mIU/mL)   <=1 WEEK        5 - 50     2 WEEKS       50 - 500     3 WEEKS       100 - 10,000     4 WEEKS     1,000 - 30,000     5 WEEKS     3,500 - 115,000   6-8 WEEKS     12,000 - 270,000    12 WEEKS     15,000 - 220,000        FEMALE AND NON-PREGNANT FEMALE:     LESS THAN 5 mIU/mL Performed at [facility], [address] [place], [place] [address]     Lab Results  Component Value Date   CREATININE 0.76 04/23/2023   CREATININE 0.68 04/18/2023   CREATININE 0.59 03/03/2023      Latest Ref Rng & Units 04/23/2023    6:21 PM 04/18/2023    9:32 AM 03/03/2023   11:00 AM  CMP  Glucose 70 - 99 mg/dL 914  782  956   BUN 6 - 20 mg/dL 21  17  18    Creatinine 0.44 - 1.00 mg/dL 2.13  0.86  5.78   Sodium 135 - 145 mmol/L 133  136  134   Potassium 3.5 - 5.1 mmol/L 2.3  4.7  3.8   Chloride 98 - 111 mmol/L 101  102  103   CO2 22 - 32 mmol/L 17  21  23    Calcium 8.9 - 10.3 mg/dL 8.4  8.8  8.7   Total Protein 6.5 - 8.1 g/dL 6.6  6.3    Total Bilirubin 0.3 - 1.2 mg/dL 0.7  0.5    Alkaline Phos 38 - 126 U/L 94  103    AST 15 - 41 U/L 32  16    ALT 0 - 44 U/L 30  24     Unresulted Labs (From admission, onward)     Start     Ordered   04/24/23 0500  Comprehensive metabolic panel  Tomorrow morning,   R        04/23/23 2149   04/24/23 0500  CBC  Tomorrow morning,   R        04/23/23 2149   04/23/23 2143  Hemoglobin A1c  Add-on,   AD        04/23/23 2149   04/23/23 1821  Pathologist smear review  Once,   R        04/23/23 1821           Pt has received : Orders Placed This Encounter  Procedures   Critical Care    This order was created via procedure documentation    Standing Status:   Standing    Number of Occurrences:   1   Comprehensive metabolic panel    Standing  Status:   Standing    Number of Occurrences:   1   CBC with Differential    Standing Status:   Standing    Number of Occurrences:   1   hCG, quantitative, pregnancy    Standing Status:   Standing    Number of Occurrences:   1   Pathologist smear review    Standing Status:   Standing    Number of Occurrences:   1   Magnesium    Standing Status:   Standing    Number  of Occurrences:   1   Comprehensive metabolic panel    Standing Status:   Standing    Number of Occurrences:   1   CBC    Standing Status:   Standing    Number of Occurrences:   1   Hemoglobin A1c    Standing Status:   Standing    Number of Occurrences:   1   Diet Carb Modified Fluid consistency: Thin; Room service appropriate? Yes    Standing Status:   Standing    Number of Occurrences:   1    Order Specific Question:   Diet-HS Snack?    Answer:   Nothing    Order Specific Question:   Calorie Level    Answer:   Medium 1600-2000    Order Specific Question:   Fluid consistency:    Answer:   Thin    Order Specific Question:   Room service appropriate?    Answer:   Yes   Maintain IV access    Standing Status:   Standing    Number of Occurrences:   1   Vital signs    Standing Status:   Standing    Number of Occurrences:   1   Notify physician (specify)    Standing Status:   Standing    Number of Occurrences:   20    Order Specific Question:   Notify Physician    Answer:   for pulse less than 55 or greater than 120    Order Specific Question:   Notify Physician    Answer:   for respiratory rate less than 12 or greater than 25    Order Specific Question:   Notify Physician    Answer:   for temperature greater than 100.5 F    Order Specific Question:   Notify Physician    Answer:   for urinary output less than 30 mL/hr for four hours    Order Specific Question:   Notify Physician    Answer:   for systolic BP less than 90 or greater than 160, diastolic BP less than 60 or greater than 100    Order Specific  Question:   Notify Physician    Answer:   for new hypoxia w/ oxygen saturations < 88%   Progressive Mobility Protocol: No Restrictions    Standing Status:   Standing    Number of Occurrences:   1   Daily weights    Standing Status:   Standing    Number of Occurrences:   1   Intake and Output    Standing Status:   Standing    Number of Occurrences:   1   Do not place and if present remove PureWick    Standing Status:   Standing    Number of Occurrences:   1   Initiate Oral Care Protocol    Standing Status:   Standing    Number of Occurrences:   1   Initiate Carrier Fluid Protocol    Standing Status:   Standing    Number of Occurrences:   1   RN may order General Admission PRN Orders utilizing "General Admission PRN medications" (through manage orders) for the following patient needs: allergy symptoms (Claritin), cold sores (Carmex), cough (Robitussin DM), eye irritation (Liquifilm Tears), hemorrhoids (Tucks), indigestion (Maalox), minor skin irritation (Hydrocortisone Cream), muscle pain (Ben Gay), nose irritation (saline nasal spray) and sore throat (Chloraseptic spray).    Standing Status:   Standing  Number of Occurrences:   [phone]   Cardiac Monitoring Continuous x 24 hours Indications for use: Other; other indications for use: LIP SWELLING    Standing Status:   Standing    Number of Occurrences:   1    Order Specific Question:   Indications for use:    Answer:   Other    Order Specific Question:   other indications for use:    Answer:   LIP SWELLING   Apply Diabetes Mellitus Care Plan    Standing Status:   Standing    Number of Occurrences:   1   STAT CBG when hypoglycemia is suspected. If treated, recheck every 15 minutes after each treatment until CBG >/= 70 mg/dl    Standing Status:   Standing    Number of Occurrences:   1   Refer to Hypoglycemia Protocol Sidebar Report for treatment of CBG < 70 mg/dl    Standing Status:   Standing    Number of Occurrences:   1   No HS  correction Insulin    Standing Status:   Standing    Number of Occurrences:   1   Full code    Standing Status:   Standing    Number of Occurrences:   1    Order Specific Question:   By:    Answer:   Other   Consult to hospitalist    Standing Status:   Standing    Number of Occurrences:   1    Order Specific Question:   Place call to:    Answer:   [phone]    Order Specific Question:   Reason for Consult    Answer:   Admit    Order Specific Question:   Diagnosis/Clinical Info for Consult:    Answer:   anaphylaxis   Pulse oximetry check with vital signs    Standing Status:   Standing    Number of Occurrences:   1   Oxygen therapy Mode or (Route): Nasal cannula; Liters Per Minute: 2; Keep 02 saturation: greater than 92 %    Standing Status:   Standing    Number of Occurrences:   20    Order Specific Question:   Mode or (Route)    Answer:   Nasal cannula    Order Specific Question:   Liters Per Minute    Answer:   2    Order Specific Question:   Keep 02 saturation    Answer:   greater than 92 %   Pulse oximetry, continuous    Standing Status:   Standing    Number of Occurrences:   1   EKG 12-Lead    Standing Status:   Standing    Number of Occurrences:   1   Place in observation (patient's expected length of stay will be less than 2 midnights)    Standing Status:   Standing    Number of Occurrences:   1    Order Specific Question:   Hospital Area    Answer:   [facility] [[id number]]    Order Specific Question:   Level of Care    Answer:   Telemetry Medical [104]    Order Specific Question:   Covid Evaluation    Answer:   Asymptomatic - no recent exposure (last 10 days) testing not required    Order Specific Question:   Diagnosis    Answer:   Lip swelling [811914]    Order Specific Question:  Admitting Physician    Answer:   Darrold Junker    Order Specific Question:   Attending Physician    Answer:   Darrold Junker    Meds ordered  this encounter  Medications   EPINEPHrine (EPI-PEN) injection 0.3 mg   EPINEPHrine (EPI-PEN) injection 0.3 mg   methylPREDNISolone sodium succinate (SOLU-MEDROL) 125 mg/2 mL injection 125 mg    IV methylprednisolone will be converted to either a q12h or q24h frequency with the same total daily dose (TDD).  Ordered Dose: 1 to 125 mg TDD; convert to: TDD q24h.  Ordered Dose: 126 to 250 mg TDD; convert to: TDD div q12h.  Ordered Dose: >250 mg TDD; DAW.   diphenhydrAMINE (BENADRYL) injection 50 mg   famotidine (PEPCID) IVPB 20 mg premix   EPINEPHrine (ADRENALIN) 5 mg in NS 250 mL (0.02 mg/mL) premix infusion   potassium chloride SA (KLOR-CON M) CR tablet 40 mEq   potassium chloride 10 mEq in 100 mL IVPB   heparin injection 5,000 Units   sodium chloride flush (NS) 0.9 % injection 3 mL   OR Linked Order Group    acetaminophen (TYLENOL) tablet 650 mg    acetaminophen (TYLENOL) suppository 650 mg   lactated ringers infusion   famotidine (PEPCID) IVPB 20 mg premix   methylPREDNISolone sodium succinate (SOLU-MEDROL) 125 mg/2 mL injection 80 mg    IV methylprednisolone will be converted to either a q12h or q24h frequency with the same total daily dose (TDD).  Ordered Dose: 1 to 125 mg TDD; convert to: TDD q24h.  Ordered Dose: 126 to 250 mg TDD; convert to: TDD div q12h.  Ordered Dose: >250 mg TDD; DAW.   EPINEPHrine (EPI-PEN) injection 0.3 mg   loratadine (CLARITIN) tablet 10 mg   pantoprazole (PROTONIX) injection 40 mg   insulin aspart (novoLOG) injection 0-20 Units    Order Specific Question:   Correction coverage:    Answer:   Resistant (obese, steroids)    Order Specific Question:   CBG < 70:    Answer:   Implement Hypoglycemia Standing Orders and refer to Hypoglycemia Standing Orders sidebar report    Order Specific Question:   CBG 70 - 120:    Answer:   0 units    Order Specific Question:   CBG 121 - 150:    Answer:   3 units    Order Specific Question:   CBG 151 - 200:    Answer:    4 units    Order Specific Question:   CBG 201 - 250:    Answer:   7 units    Order Specific Question:   CBG 251 - 300:    Answer:   11 units    Order Specific Question:   CBG 301 - 350:    Answer:   15 units    Order Specific Question:   CBG 351 - 400:    Answer:   20 units    Order Specific Question:   CBG > 400    Answer:   call MD and obtain STAT lab verification    Admission Imaging : No results found. Physical Examination: Vitals:   04/23/23 1743 04/23/23 1800 04/23/23 1830 04/24/23 0041  BP:  (!) 153/83 (!) 166/85 127/79  Pulse:  (!) 109 99 70  Temp:    98.2 F (36.8 C)  Resp:  (!) 27 (!) 28 (!) 24  Weight: 85.1 kg     SpO2:  98% 100% 98%  TempSrc:    Oral  BMI (Calculated): 31.22      Physical Exam Vitals and nursing note reviewed.  Constitutional:      General: She is not in acute distress.    Appearance: Normal appearance. She is not ill-appearing, toxic-appearing or diaphoretic.  HENT:     Head: Normocephalic and atraumatic.     Right Ear: Hearing and external ear normal.     Left Ear: Hearing and external ear normal.     Nose: Nose normal. No nasal deformity.     Mouth/Throat:     Lips: Pink.     Mouth: Mucous membranes are moist.     Tongue: No lesions.     Pharynx: Oropharynx is clear.  Eyes:     Extraocular Movements: Extraocular movements intact.     Pupils: Pupils are equal, round, and reactive to light.  Neck:     Vascular: No carotid bruit.  Cardiovascular:     Rate and Rhythm: Normal rate and regular rhythm.     Pulses: Normal pulses.     Heart sounds: Normal heart sounds.  Pulmonary:     Effort: Pulmonary effort is normal.     Breath sounds: Examination of the right-lower field reveals wheezing. Examination of the left-lower field reveals wheezing. Wheezing present.  Abdominal:     General: Bowel sounds are normal. There is no distension.     Palpations: Abdomen is soft. There is no mass.     Tenderness: There is no abdominal tenderness.  There is no guarding.     Hernia: No hernia is present.  Musculoskeletal:     Right lower leg: No edema.     Left lower leg: No edema.  Skin:    General: Skin is warm.  Neurological:     General: No focal deficit present.     Mental Status: She is alert and oriented to person, place, and time.     Cranial Nerves: Cranial nerves 2-12 are intact.     Motor: Motor function is intact.  Psychiatric:        Attention and Perception: Attention normal.        Mood and Affect: Mood normal.        Speech: Speech normal.        Behavior: Behavior normal. Behavior is cooperative.        Cognition and Memory: Cognition normal.     Assessment and Plan: >> Angioedema with anaphylaxis: Differentials include acute allergic angioedema versus angioedema with anaphylaxis: Due to severity of presentation we will admit patient to telemetry unit with continuous cardiac monitoring and pulse oximetry monitoring overnight. Will continue patient on Solu-Medrol H1 H2 blockers will consider FFP's and IV antibiotic therapy as deemed appropriate. Patient will need allergic evaluation and angioedema evaluation. We will cont with regimen started in ED.   >>DM II: Last A1c is over 10.  Pt was intolerant to metformin. Has not yet received [place]. We will start pt on glycemic protocol.    DVT prophylaxis:  SCDs Code Status:  Full code    04/23/2023    5:46 PM  Advanced Directives  Does Patient Have a Medical Advance Directive? No    Family Communication:  None Emergency Contact: Contact Information     Name Relation Home Work Mobile   [place] Daughter   [phone]       Disposition Plan:  Home Consults: None Admission status: Observation Unit / Expected LOS: Med/tele  Gertha Calkin MD [facility]  6 PM- 2 AM. [phone]( Pager )  For questions regarding this patient please use [email or web address] to contact the current [facility] MD.   Bonita Quin may also call [phone] to contact  current Assigned [facility] Attending/Consulting MD for this patient.   This:

## 2023-04-23 NOTE — ED Provider Notes (Signed)
[facility] Provider Note    Event Date/Time   First MD Initiated Contact with Patient 04/23/23 1751     (approximate)   History   Allergic Reaction   HPI  Aki Doug Sou Lynnell Chad is a 42 y.o. female  with pmh DM who p/w allergic rxn.  Patient was drinking a Boba tea prior to arrival and suddenly felt like she is not able to swallow her tongue was swollen and she became flushed.  Denies difficulty breathing vomiting or diarrhea.  No history of allergic reactions to this Boba tea.  Says she had an allergic reaction as a child but none recently and does not have an EpiPen denies any new exposures or new medications.  He is not on any ACE inhibitors or ARB's     Past Medical History:  Diagnosis Date   Acute pain of left wrist 06/04/2022   Allergy    Diabetes mellitus without complication [facility])     Patient Active Problem List   Diagnosis Date Noted   Angular cheilitis 04/17/2023   Uncontrolled type 2 diabetes mellitus with hyperglycemia (HCC) 04/16/2023   Bilateral ovarian cysts 03/06/2023   Endocervical polyp 03/06/2023   Menorrhagia with regular cycle 03/06/2023     Physical Exam  Triage Vital Signs: ED Triage Vitals  Enc Vitals Group     BP 04/23/23 1742 131/78     Pulse Rate 04/23/23 1742 (!) 109     Resp 04/23/23 1742 (!) 28     Temp --      Temp src --      SpO2 04/23/23 1742 97 %     Weight 04/23/23 1743 187 lb 9.6 oz (85.1 kg)     Height --      Head Circumference --      Peak Flow --      Pain Score 04/23/23 1742 0     Pain Loc --      Pain Edu? --      Excl. in GC? --     Most recent vital signs: Vitals:   04/23/23 1800 04/23/23 1830  BP: (!) 153/83 (!) 166/85  Pulse: (!) 109 99  Resp: (!) 27 (!) 28  SpO2: 98% 100%     General: Awake, no distress.  CV:  Good peripheral perfusion.  Resp:  Normal effort.  Abd:  No distention.  Neuro:             Awake, Alert, Oriented x 3  Other:  Patient is diffusely flushed on face  and chest tongue is swollen and somewhat elevated patient however is phonating normally without stridor no pooling of secretions she is somewhat dysarthric due to tongue swelling   ED Results / Procedures / Treatments  Labs (all labs ordered are listed, but only abnormal results are displayed) Labs Reviewed  COMPREHENSIVE METABOLIC PANEL - Abnormal; Notable for the following components:      Result Value   Sodium 133 (*)    Potassium 2.3 (*)    CO2 17 (*)    Glucose, Bld 411 (*)    BUN 21 (*)    Calcium 8.4 (*)    All other components within normal limits  CBC WITH DIFFERENTIAL/PLATELET - Abnormal; Notable for the following components:   WBC 19.1 (*)    Lymphs Abs 10.4 (*)    All other components within normal limits  HCG, QUANTITATIVE, PREGNANCY  MAGNESIUM  PATHOLOGIST SMEAR REVIEW  POC URINE PREG, ED  EKG     RADIOLOGY    PROCEDURES:  Critical Care performed: Yes  .Critical Care  Performed by: Georga Hacking, MD Authorized by: Georga Hacking, MD   Critical care provider statement:    Critical care time (minutes):  30   Critical care was time spent personally by me on the following activities:  Development of treatment plan with patient or surrogate, discussions with consultants, evaluation of patient's response to treatment, examination of patient, ordering and review of laboratory studies, ordering and review of radiographic studies, ordering and performing treatments and interventions, pulse oximetry, re-evaluation of patient's condition and review of old charts   The patient is on the cardiac monitor to evaluate for evidence of arrhythmia and/or significant heart rate changes.   MEDICATIONS ORDERED IN ED: Medications  EPINEPHrine (ADRENALIN) 5 mg in NS 250 mL (0.02 mg/mL) premix infusion (0 mcg/min Intravenous Stopped 04/23/23 2024)  potassium chloride 10 mEq in 100 mL IVPB (10 mEq Intravenous New Bag/Given 04/23/23 2033)  EPINEPHrine (EPI-PEN)  injection 0.3 mg (0.3 mg Intramuscular Given 04/23/23 1744)  EPINEPHrine (EPI-PEN) injection 0.3 mg (0.3 mg Intramuscular Given 04/23/23 1739)  methylPREDNISolone sodium succinate (SOLU-MEDROL) 125 mg/2 mL injection 125 mg (125 mg Intravenous Given 04/23/23 1747)  diphenhydrAMINE (BENADRYL) injection 50 mg (50 mg Intravenous Given 04/23/23 1748)  famotidine (PEPCID) IVPB 20 mg premix (0 mg Intravenous Stopped 04/23/23 1819)  potassium chloride SA (KLOR-CON M) CR tablet 40 mEq (40 mEq Oral Given 04/23/23 2028)     IMPRESSION / MDM / ASSESSMENT AND PLAN / ED COURSE  I reviewed the triage vital signs and the nursing notes.                              Patient's presentation is most consistent with acute presentation with potential threat to life or bodily function.  Differential diagnosis includes, but is not limited to, anaphylaxis, angioedema  Patient is a 42 year old female who presents with anaphylaxis.  This started about 20 minutes prior to arrival after drinking a Boba tea which she has had in the past developed difficulty swallowing tongue swelling and flushing.  On arrival she is saturating well hypertensive tachycardic.  Her tongue is swollen somewhat lifted and lips are mildly swollen as well she is phonating okay.  I am able to see the posterior oropharynx there is not any significant edema.  She is not having stridor or pooling of secretions but tongue is significantly swollen.  No wheezing.  She received IM epinephrine but did not have complete resolution of symptoms and received another dose of IM epinephrine as well as bolus of fluids Solu-Medrol Pepcid and Benadryl.  Despite 2 doses of IM epinephrine continue to have tongue swelling although did have some improvement.  She was thus placed on epinephrine drip at 10 mcg/h.  On reassessment after being on epi drip, symptoms have significantly improved.  The epi drip was down titrated and eventually turned off.  Labs were drawn which are notable  for hypokalemia with potassium 2.3.  Will supplement orally and IV but I suspect that this is in part due to shifting still not be overaggressive.  I have added on magnesium.  Given the severity of presentation and the fact that she required multiple doses of epinephrine epinephrine drip she will require admission.  Will discuss with the hospitalist.       FINAL CLINICAL IMPRESSION(S) / ED DIAGNOSES   Final diagnoses:  Anaphylaxis, initial encounter     Rx / DC Orders   ED Discharge Orders     None        Note:  This document was prepared using Dragon voice recognition software and may include unintentional dictation errors.   Georga Hacking, MD 04/23/23 2035

## 2023-04-24 ENCOUNTER — Encounter: Payer: Self-pay | Admitting: Internal Medicine

## 2023-04-24 DIAGNOSIS — R220 Localized swelling, mass and lump, head: Secondary | ICD-10-CM | POA: Diagnosis not present

## 2023-04-24 LAB — CBC
HCT: 38.6 % (ref 36.0–46.0)
Hemoglobin: 12.9 g/dL (ref 12.0–15.0)
MCH: 29.1 pg (ref 26.0–34.0)
MCHC: 33.4 g/dL (ref 30.0–36.0)
MCV: 86.9 fL (ref 80.0–100.0)
Platelets: 315 10*3/uL (ref 150–400)
RBC: 4.44 MIL/uL (ref 3.87–5.11)
RDW: 14.2 % (ref 11.5–15.5)
WBC: 12.7 10*3/uL — ABNORMAL HIGH (ref 4.0–10.5)
nRBC: 0 % (ref 0.0–0.2)

## 2023-04-24 LAB — COMPREHENSIVE METABOLIC PANEL
ALT: 34 U/L (ref 0–44)
AST: 22 U/L (ref 15–41)
Albumin: 3.7 g/dL (ref 3.5–5.0)
Alkaline Phosphatase: 103 IU/L (ref 44–121)
Alkaline Phosphatase: 79 U/L (ref 38–126)
Anion gap: 9 (ref 5–15)
BUN: 19 mg/dL (ref 6–20)
CO2: 21 mmol/L (ref 20–29)
CO2: 17 mmol/L — ABNORMAL LOW (ref 22–32)
Calcium: 8.6 mg/dL — ABNORMAL LOW (ref 8.9–10.3)
Chloride: 102 mmol/L (ref 96–106)
Chloride: 109 mmol/L (ref 98–111)
Creatinine, Ser: 0.49 mg/dL (ref 0.44–1.00)
GFR, Estimated: >60 mL/min (ref >60)
Globulin, Total: 2.2 g/dL (ref 1.5–4.5)
Glucose, Bld: 316 mg/dL — ABNORMAL HIGH (ref 70–99)
Glucose: 263 mg/dL — ABNORMAL HIGH (ref 70–99)
Potassium: 4.7 mmol/L (ref 3.5–5.2)
Potassium: 4 mmol/L (ref 3.5–5.1)
Sodium: 135 mmol/L (ref 135–145)
Total Bilirubin: 0.7 mg/dL (ref 0.3–1.2)
Total Protein: 6.5 g/dL (ref 6.5–8.1)

## 2023-04-24 LAB — CBG MONITORING, ED
Glucose-Capillary: 244 mg/dL — ABNORMAL HIGH (ref 70–99)
Glucose-Capillary: 350 mg/dL — ABNORMAL HIGH (ref 70–99)
Glucose-Capillary: 295 mg/dL — ABNORMAL HIGH (ref 70–99)

## 2023-04-24 LAB — HEPATITIS C ANTIBODY: Hep C Virus Ab: NONREACTIVE

## 2023-04-24 LAB — MICROALBUMIN / CREATININE URINE RATIO
Creatinine, Urine: 62.5 mg/dL
Microalb/Creat Ratio: <5 mg/g creat (ref 0–29)

## 2023-04-24 LAB — PATHOLOGIST SMEAR REVIEW

## 2023-04-24 LAB — LIPID PANEL
Cholesterol, Total: 143 mg/dL (ref 100–199)
VLDL Cholesterol Cal: 15 mg/dL (ref 5–40)

## 2023-04-24 LAB — HEMOGLOBIN A1C
Hgb A1c MFr Bld: 10.5 % — ABNORMAL HIGH (ref 4.8–5.6)
Mean Plasma Glucose: 254.65 mg/dL

## 2023-04-24 LAB — PHOSPHORUS: Phosphorus: 3.8 mg/dL (ref 2.5–4.6)

## 2023-04-24 LAB — MAGNESIUM: Magnesium: 2 mg/dL (ref 1.7–2.4)

## 2023-04-24 LAB — HIV ANTIBODY (ROUTINE TESTING W REFLEX): HIV Screen 4th Generation wRfx: NONREACTIVE

## 2023-04-24 MED ORDER — EPINEPHRINE 0.3 MG/0.3ML IJ SOAJ: 0.3000 mg | Freq: Once | INTRAMUSCULAR | 0 refills | Status: AC | PRN

## 2023-04-24 MED ORDER — GLIPIZIDE 2.5 MG PO TABS: 2.5000 mg | ORAL_TABLET | Freq: Two times a day (BID) | ORAL | 11 refills | Status: DC

## 2023-04-24 MED ORDER — SODIUM BICARBONATE 650 MG PO TABS
650.0000 mg | ORAL_TABLET | Freq: Four times a day (QID) | ORAL | Status: DC
  Administered 2023-04-24: 650 mg via ORAL
  Filled 2023-04-24 (×2): qty 1

## 2023-04-24 MED ORDER — INSULIN ASPART 100 UNIT/ML IJ SOLN
0.0000 [IU] | Freq: Three times a day (TID) | INTRAMUSCULAR | Status: DC
  Administered 2023-04-24: 7 [IU] via SUBCUTANEOUS
  Administered 2023-04-24: 15 [IU] via SUBCUTANEOUS
  Filled 2023-04-24 (×2): qty 1

## 2023-04-24 NOTE — ED Notes (Signed)
Messaged MD regarding insulin and post meal CBG.

## 2023-04-24 NOTE — Discharge Summary (Signed)
[facility] Discharge Summary   Patient: Samantha Bonilla [id number]  PCP: Sherlyn Hay, DO  Date of admission: 04/23/2023   Date of discharge: 04/24/2023     Discharge Diagnoses:  Principal diagnosis  Principal Problem:   Lip swelling   Admitted From: Home Disposition:  Home   Recommendations for Outpatient Follow-up:  PCP: In 1 week Follow up LABS/TEST: Hemoglobin A1c in 3 months   Diet recommendation: Carb modified diet  Activity: The patient is advised to gradually reintroduce usual activities, as tolerated  Discharge Condition: stable  Code Status: Full code   History of present illness: As per the H and P dictated on admission Hospital Course:  Andree ouita imani is an 42 y.o. female with PMH of NIDDM T2, as reviewed from EMR, presented at [facility] ED with complaints of tongue swelling and being flushed after drinking Boba tea.  Patient has a history of allergies to penicillin and allergies during childhood but has not required to keep an EpiPen.   Patient report any chest pain or vomiting shortness of breath chest pain palpitations.     Assessment and Plan: # Angioedema with anaphylaxis  Patient developed allergic reaction after drinking Boba Tea.  The ED patient was given IV Solu-Medrol, IV Pepcid, epinephrine IM, Benadryl IV Patient's symptoms resolved, in the morning time patient was feeling fine, able to swallow easily without any difficulty, no respiratory distress.  Patient stated that she would like to go home.  Patient was discharged on EpiPen to prevent future anaphylactic reaction.  Patient was advised to follow-up with PCP and allergist as an outpatient. # NIDDM T2, patient cannot tolerate metformin, has not started Januvia yet.  Hemoglobin A1c 10.5 uncontrolled diabetes.  Patient was advised to continue Januvia 100 mg p.o. daily and started glipizide 2.5 mg p.o. twice daily.  Patient was advised to monitor fingerstick blood glucose at home  and follow with PCP to titrate medications accordingly.  Repeat hemoglobin A1c after 3 months.  Body mass index is 31.22 kg/m.  Nutrition Interventions:   Patient was ambulatory without any assistance. On the day of the discharge the patient's vitals were stable, and no other acute medical condition were reported by patient. the patient was felt safe to be discharge at Home.  Consultants: None Procedures: None  Discharge Exam: General: Appear in no distress, no Rash; Oral Mucosa Clear, moist. Cardiovascular: S1 and S2 Present, no Murmur, Respiratory: normal respiratory effort, Bilateral Air entry present and no Crackles, no wheezes Abdomen: Bowel Sound present, Soft and no tenderness, no hernia Extremities: no Pedal edema, no calf tenderness Neurology: alert and oriented to time, place, and person affect appropriate.  Filed Weights   04/23/23 1743  Weight: 85.1 kg   Vitals:   04/24/23 1000 04/24/23 1407  BP: 113/68 122/74  Pulse: 70 63  Resp: (!) 24 18  Temp:  98.4 F (36.9 C)  SpO2: 99% 98%    DISCHARGE MEDICATION: Allergies as of 04/24/2023       Reactions   Penicillins Rash        Medication List     STOP taking these medications    metFORMIN 500 MG tablet Commonly known as: Glucophage   norethindrone 0.35 MG tablet Commonly known as: MICRONOR       TAKE these medications    Blood Glucose Monitoring Suppl Devi 1 each by Does not apply route in the morning, at noon, and at bedtime. May substitute to any manufacturer covered by patient's  insurance.   BLOOD GLUCOSE TEST STRIPS Strp 1 each by In Vitro route daily before breakfast. May substitute to any manufacturer covered by patient's insurance.   EPINEPHrine 0.3 mg/0.3 mL Soaj injection Commonly known as: EPI-PEN Inject 0.3 mg into the muscle once as needed (SOB/ ANAPHYLAXIS / ANGIOEDEMA. / HYPERSENSITIVITY REACTION/).   glipiZIDE 2.5 MG Tabs Take 2.5 mg by mouth 2 (two) times daily.    hydrocortisone 1 % ointment Apply 1 Application topically 2 (two) times daily for 14 days. Aplique una pequena cantidad al [employer] veces al dia.   Lancet Device Misc 1 each by Does not apply route in the morning, at noon, and at bedtime. May substitute to any manufacturer covered by patient's insurance.   Lancets Misc. Misc 1 each by Does not apply route daily before breakfast. May substitute to any manufacturer covered by patient's insurance.   sitaGLIPtin 100 MG tablet Commonly known as: Januvia Take 1 tablet (100 mg total) by mouth daily.       Allergies  Allergen Reactions   Penicillins Rash   Discharge Instructions     Call MD for:  difficulty breathing, headache or visual disturbances   Complete by: As directed    Call MD for:  extreme fatigue   Complete by: As directed    Call MD for:  hives   Complete by: As directed    Call MD for:  persistant dizziness or light-headedness   Complete by: As directed    Call MD for:  persistant nausea and vomiting   Complete by: As directed    Call MD for:  severe uncontrolled pain   Complete by: As directed    Call MD for:  temperature >100.4   Complete by: As directed    Diet Carb Modified   Complete by: As directed    Discharge instructions   Complete by: As directed    Follow-up with PCP in 1 to 2 weeks, continue to monitor serum glucose at home.  Continue Januvia, prescribed glipizide 2.5 mg p.o. daily.  Repeat hemoglobin A1c after 3 months, follow with PCP for further management as an outpatient.  Continue diabetic diet.   Increase activity slowly   Complete by: As directed        The results of significant diagnostics from this hospitalization (including imaging, microbiology, ancillary and laboratory) are listed below for reference.    Significant Diagnostic Studies: No results found.  Microbiology: No results found for this or any previous visit (from the past 240 hour(s)).   Labs: CBC: Recent Labs   Lab 04/23/23 1821 04/24/23 0451  WBC 19.1* 12.7*  NEUTROABS 7.4  --   HGB 13.3 12.9  HCT 39.1 38.6  MCV 87.1 86.9  PLT 373 315   Basic Metabolic Panel: Recent Labs  Lab 04/18/23 0932 04/23/23 1821 04/24/23 0451  NA 136 133* 135  K 4.7 2.3* 4.0  CL 102 101 109  CO2 21 17* 17*  GLUCOSE 263* 411* 316*  BUN 17 21* 19  CREATININE 0.68 0.76 0.49  CALCIUM 8.8 8.4* 8.6*  MG  --  1.8  --    Liver Function Tests: Recent Labs  Lab 04/18/23 0932 04/23/23 1821 04/24/23 0451  AST 16 32 22  ALT 24 30 34  ALKPHOS 103 94 79  BILITOT 0.5 0.7 0.7  PROT 6.3 6.6 6.5  ALBUMIN 4.1 3.6 3.7   No results for input(s): "LIPASE", "AMYLASE" in the last 168 hours. No results for input(s): "AMMONIA" in  the last 168 hours. Cardiac Enzymes: No results for input(s): "CKTOTAL", "CKMB", "CKMBINDEX", "TROPONINI" in the last 168 hours. BNP (last 3 results) No results for input(s): "BNP" in the last 8760 hours. CBG: Recent Labs  Lab 04/24/23 0759 04/24/23 1231 04/24/23 1406  GLUCAP 244* 350* 295*    Time spent: 35 minutes  Signed:  Gillis Santa  [facility] 04/24/2023  3:38 PM

## 2023-04-24 NOTE — ED Notes (Signed)
Patient eating breakfast tray at this time. 

## 2023-04-24 NOTE — ED Notes (Signed)
Pt and daughter verbalizes understanding of discharge instructions. Opportunity for questioning and answers were provided. Pt discharged from ED to home with daughter.

## 2023-04-24 NOTE — ED Notes (Signed)
Patient resting comfortably in stretcher. Nad noted at this time. Family at bedside.

## 2023-04-24 NOTE — Inpatient Diabetes Management (Signed)
Inpatient Diabetes Program Recommendations  AACE/ADA: New Consensus Statement on Inpatient Glycemic Control (2015)  Target Ranges:  Prepandial:   less than 140 mg/dL      Peak postprandial:   less than 180 mg/dL (1-2 hours)      Critically ill patients:  140 - 180 mg/dL    Latest Reference Range & Units 04/24/23 07:59  Glucose-Capillary 70 - 99 mg/dL 409 (H)  7 units Novolog   (H): Data is abnormally high    To ED with Angioedema with anaphylaxis after drinking Boba tea  History: DM2  Home DM Meds: Januvia 100 mg Daily (just Rxd by PCP on 04/16/2023--Hasn't started yet per MD notes)    Metformin (NOT taking)  Current Orders: Novolog Resistant Correction Scale/ SSI (0-20 units) TID AC    PCP: [facility]  Of note, pt given 125 mg Solumedrol yest at 6pm then Started on Solumedrol 80 mg BID  Starting Novolog SSi this AM    Received [place] from Dr. Lucianne Muss re: this pt.  Have contacted pt's PCP office and they relayed to me that pt's Januvia has received Prior Auth and is waiting for pick up at [facility] on [address]  Dr. Lucianne Muss also planning to d/c pt home on Glipizide 2.5 mg BID.  Alerted MD that Januvia Rx is waiting for pt pick up.  Called RN in the ED and asked RN to please let pt know that she will need to pick up both the Januvia and the Glipizide at [facility].  Both Rxs should be $4.  Januvia is covered by [employer] per PCP office.      --Will follow patient during hospitalization--  Ambrose Finland RN, MSN, CDCES Diabetes Coordinator Inpatient Glycemic Control Team Team Pager: [phone] (8a-5p)

## 2023-04-24 NOTE — ED Notes (Signed)
RN introduced self to patient. Pt denies itching, swelling at this time. Pt speaking in full sentences, breathing unlabored, and maintaining secretions with ease. Symmetric chest rise and fall noted. Pt is alert and oriented following commands. Pt is resting in bed with cardiac monitor in place. Bed low and locked with side rails raised x2. Call bell in reach and daughter at bedside.

## 2023-04-24 NOTE — ED Notes (Signed)
Messaged MD regarding epi pen for discharge

## 2023-04-25 ENCOUNTER — Ambulatory Visit (INDEPENDENT_AMBULATORY_CARE_PROVIDER_SITE_OTHER): Payer: Medicaid Other | Admitting: Physician Assistant

## 2023-04-25 VITALS — BP 115/70 | HR 86 | Wt 174.8 lb

## 2023-04-25 DIAGNOSIS — T7840XD Allergy, unspecified, subsequent encounter: Secondary | ICD-10-CM

## 2023-04-25 DIAGNOSIS — E1165 Type 2 diabetes mellitus with hyperglycemia: Secondary | ICD-10-CM | POA: Diagnosis not present

## 2023-04-25 LAB — GLUCOSE, POCT (MANUAL RESULT ENTRY): POC Glucose: 357 mg/dl — AB (ref 70–99)

## 2023-04-25 MED ORDER — GLIPIZIDE 2.5 MG PO TABS
2.5000 mg | ORAL_TABLET | Freq: Two times a day (BID) | ORAL | 11 refills | Status: DC
Start: 2023-04-25 — End: 2023-06-25

## 2023-04-25 MED ORDER — GLIPIZIDE 2.5 MG PO TABS
2.5000 mg | ORAL_TABLET | Freq: Two times a day (BID) | ORAL | 11 refills | Status: DC
Start: 2023-04-25 — End: 2023-04-25

## 2023-04-25 NOTE — Progress Notes (Unsigned)
    I,Sha'taria Tyson,acting as a Neurosurgeon for [employer], PA-C.,have documented all relevant documentation on the behalf of Debera Lat, PA-C,as directed by  [employer], PA-C while in the presence of [employer], PA-C.   Established patient visit  Patient: Samantha Bonilla   DOB: 1981-03-09   42 y.o. Female  MRN: [id number] Visit Date: 04/25/2023  Today's healthcare provider: Debera Lat, PA-C   No chief complaint on file.  Subjective    HPI  -Patient assisted today by her Daughter Follow up Hospitalization  Patient was admitted to [facility] on 04/23/23 and discharged on 04/24/23. She was treated for anaphylaxis. Treatment for this included Rx for epinephrine and glipizide 2.5 mg . Telephone follow up was done on n/a She reports poor compliance with treatment.Will have rx available for patient Monday.  She reports this condition is  slowly resolving . 264    ----------------------------------------------------------------------------------------- -      04/16/2023    3:49 PM  Depression screen PHQ 2/9  Decreased Interest 0  Down, Depressed, Hopeless 0  PHQ - 2 Score 0  Altered sleeping 0  Tired, decreased energy 0  Change in appetite 0  Feeling bad or failure about yourself  0  Trouble concentrating 0  Moving slowly or fidgety/restless 0  Suicidal thoughts 0  PHQ-9 Score 0  Difficult doing work/chores Not difficult at all       No data to display          Medications: Outpatient Medications Prior to Visit  Medication Sig   Blood Glucose Monitoring Suppl DEVI 1 each by Does not apply route in the morning, at noon, and at bedtime. May substitute to any manufacturer covered by patient's insurance.   EPINEPHrine 0.3 mg/0.3 mL IJ SOAJ injection Inject 0.3 mg into the muscle once as needed (SOB/ ANAPHYLAXIS / ANGIOEDEMA. / HYPERSENSITIVITY REACTION/).   glipiZIDE 2.5 MG TABS Take 2.5 mg by mouth 2 (two) times daily.   Glucose Blood (BLOOD GLUCOSE TEST  STRIPS) STRP 1 each by In Vitro route daily before breakfast. May substitute to any manufacturer covered by patient's insurance.   hydrocortisone 1 % ointment Apply 1 Application topically 2 (two) times daily for 14 days. Aplique una pequena cantidad al [employer] veces al dia.   Lancet Device MISC 1 each by Does not apply route in the morning, at noon, and at bedtime. May substitute to any manufacturer covered by patient's insurance.   Lancets Misc. MISC 1 each by Does not apply route daily before breakfast. May substitute to any manufacturer covered by patient's insurance.   sitaGLIPtin (JANUVIA) 100 MG tablet Take 1 tablet (100 mg total) by mouth daily.   No facility-administered medications prior to visit.    Review of Systems Except see HPI   {Labs  Heme  Chem  Endocrine  Serology  Results Review (optional):23779}   Objective    There were no vitals taken for this visit. {Show previous vital signs (optional):23777}  Physical Exam   No results found for any visits on 04/25/23.  Assessment & Plan    *** Glipizide printed, januvia, diet No follow-ups on file.      [facility]

## 2023-04-26 LAB — CBC WITH DIFFERENTIAL/PLATELET
Basophils Absolute: 0 10*3/uL (ref 0.0–0.2)
Basos: 0 %
EOS (ABSOLUTE): 0 10*3/uL (ref 0.0–0.4)
Eos: 0 %
Hematocrit: 38.8 % (ref 34.0–46.6)
Hemoglobin: 12.5 g/dL (ref 11.1–15.9)
Immature Grans (Abs): 0 10*3/uL (ref 0.0–0.1)
Immature Granulocytes: 0 %
Lymphocytes Absolute: 4.4 10*3/uL — ABNORMAL HIGH (ref 0.7–3.1)
Lymphs: 23 %
MCH: 28.8 pg (ref 26.6–33.0)
MCHC: 32.2 g/dL (ref 31.5–35.7)
MCV: 89 fL (ref 79–97)
Monocytes Absolute: 0.7 10*3/uL (ref 0.1–0.9)
Monocytes: 4 %
Neutrophils Absolute: 13.9 10*3/uL — ABNORMAL HIGH (ref 1.4–7.0)
Neutrophils: 73 %
Platelets: 328 10*3/uL (ref 150–450)
RBC: 4.34 x10E6/uL (ref 3.77–5.28)
RDW: 14 % (ref 11.7–15.4)
WBC: 19.2 10*3/uL — ABNORMAL HIGH (ref 3.4–10.8)

## 2023-04-26 LAB — COMPREHENSIVE METABOLIC PANEL
ALT: 31 IU/L (ref 0–32)
AST: 18 IU/L (ref 0–40)
Albumin/Globulin Ratio: 1.8 (ref 1.2–2.2)
Albumin: 4.1 g/dL (ref 3.9–4.9)
Alkaline Phosphatase: 101 IU/L (ref 44–121)
BUN/Creatinine Ratio: 24 — ABNORMAL HIGH (ref 9–23)
BUN: 21 mg/dL (ref 6–24)
Bilirubin Total: 0.3 mg/dL (ref 0.0–1.2)
CO2: 22 mmol/L (ref 20–29)
Calcium: 9.2 mg/dL (ref 8.7–10.2)
Chloride: 99 mmol/L (ref 96–106)
Creatinine, Ser: 0.86 mg/dL (ref 0.57–1.00)
Globulin, Total: 2.3 g/dL (ref 1.5–4.5)
Glucose: 340 mg/dL — ABNORMAL HIGH (ref 70–99)
Potassium: 4.5 mmol/L (ref 3.5–5.2)
Sodium: 135 mmol/L (ref 134–144)
Total Protein: 6.4 g/dL (ref 6.0–8.5)
eGFR: 86 mL/min/{1.73_m2} (ref >59)

## 2023-04-27 ENCOUNTER — Encounter: Payer: Self-pay | Admitting: Physician Assistant

## 2023-04-28 ENCOUNTER — Telehealth: Payer: Self-pay

## 2023-04-28 ENCOUNTER — Encounter: Payer: Self-pay | Admitting: Family Medicine

## 2023-04-28 NOTE — Telephone Encounter (Signed)
Patient reports she went to work today. And was sent home due to feeling "weak." She reports she has not been feeling well since her ED visit. She reports feeling weak mostly with activity. She denies any shortness of breath. She reports sleeping well. She reports checked her sugar at 12:30 pm 238. Please advise.

## 2023-04-28 NOTE — Progress Notes (Signed)
Spoke with pt's daughter and let her know that her mother's  labs are back and stable for her , except her high blood sugar. Confirmed with daughter that her mother's BS at home dropped to 215-250? Explained that WBC elevated, could be due to uncontrolled diabetes. Advised to repeat her CBC at her next appt  with PCP Advised to take her advised medications, check her BS at [facility], if BS is high, and her symptoms worsen she needs to go to ER.

## 2023-04-28 NOTE — Telephone Encounter (Signed)
Copied from CRM [id number]. Topic: General - Other >> Apr 28, 2023  9:23 AM Turkey B wrote: Reason for CRM: pt called in for work for note to be out today and tomorrow.

## 2023-04-29 ENCOUNTER — Encounter: Payer: Self-pay | Admitting: Physician Assistant

## 2023-04-30 NOTE — Telephone Encounter (Signed)
NA. Voicemail not set up.  

## 2023-05-07 NOTE — Telephone Encounter (Signed)
I have sent the patient a mychart message regarding this issue and one about the letter she requested asking her to reply if she is not doing better or if she still needs the letter.

## 2023-05-09 ENCOUNTER — Encounter: Payer: Self-pay | Admitting: Physician Assistant

## 2023-05-14 ENCOUNTER — Encounter: Payer: Self-pay | Admitting: Physician Assistant

## 2023-05-14 ENCOUNTER — Ambulatory Visit (INDEPENDENT_AMBULATORY_CARE_PROVIDER_SITE_OTHER): Payer: Medicaid Other | Admitting: Physician Assistant

## 2023-05-14 ENCOUNTER — Other Ambulatory Visit: Payer: Self-pay | Admitting: Physician Assistant

## 2023-05-14 VITALS — BP 114/80 | HR 90 | Temp 98.1°F | Resp 16 | Wt 172.0 lb

## 2023-05-14 DIAGNOSIS — E1165 Type 2 diabetes mellitus with hyperglycemia: Secondary | ICD-10-CM

## 2023-05-14 DIAGNOSIS — T7840XD Allergy, unspecified, subsequent encounter: Secondary | ICD-10-CM

## 2023-05-14 LAB — GLUCOSE, POCT (MANUAL RESULT ENTRY): POC Glucose: 170 mg/dl — AB (ref 70–99)

## 2023-05-14 MED ORDER — SITAGLIPTIN PHOSPHATE 100 MG PO TABS
100.0000 mg | ORAL_TABLET | Freq: Every day | ORAL | 1 refills | Status: DC
Start: 2023-05-14 — End: 2023-07-21

## 2023-05-14 MED ORDER — BLOOD GLUCOSE TEST VI STRP
ORAL_STRIP | 2 refills | Status: DC
Start: 2023-05-14 — End: 2023-05-15

## 2023-05-14 NOTE — Progress Notes (Signed)
Established patient visit   I,Roshena L Chambers,acting as a scribe for [employer], PA-C.,have documented all relevant documentation on the behalf of Debera Lat, PA-C,as directed by  [employer], PA-C while in the presence of [employer], PA-C.    Patient: Samantha Bonilla   DOB: 27-Jun-1981   42 y.o. Female  MRN: [id number] Visit Date: 05/14/2023  Today's healthcare provider: Debera Lat, PA-C   Chief Complaint  Patient presents with   Diabetes   Subjective    Diabetes Pertinent negatives for hypoglycemia include no dizziness. Pertinent negatives for diabetes include no chest pain, no fatigue and no weakness.    Home blood sugars average 120's.  The highest reading was 200.   Medications: Outpatient Medications Prior to Visit  Medication Sig   Blood Glucose Monitoring Suppl DEVI 1 each by Does not apply route in the morning, at noon, and at bedtime. May substitute to any manufacturer covered by patient's insurance.   EPINEPHrine 0.3 mg/0.3 mL IJ SOAJ injection Inject 0.3 mg into the muscle once as needed (SOB/ ANAPHYLAXIS / ANGIOEDEMA. / HYPERSENSITIVITY REACTION/).   glipiZIDE 2.5 MG TABS Take 2.5 mg by mouth 2 (two) times daily.   Glucose Blood (BLOOD GLUCOSE TEST STRIPS) STRP 1 each by In Vitro route daily before breakfast. May substitute to any manufacturer covered by patient's insurance.   Lancet Device MISC 1 each by Does not apply route in the morning, at noon, and at bedtime. May substitute to any manufacturer covered by patient's insurance.   Lancets Misc. MISC 1 each by Does not apply route daily before breakfast. May substitute to any manufacturer covered by patient's insurance.   sitaGLIPtin (JANUVIA) 100 MG tablet Take 1 tablet (100 mg total) by mouth daily.   No facility-administered medications prior to visit.    Review of Systems  Constitutional:  Negative for appetite change, chills, fatigue and fever.  Respiratory:  Negative for chest  tightness and shortness of breath.   Cardiovascular:  Negative for chest pain and palpitations.  Gastrointestinal:  Negative for abdominal pain, nausea and vomiting.  Neurological:  Negative for dizziness and weakness.       Objective    BP 114/80   Pulse 90   Temp 98.1 F (36.7 C) (Oral)   Resp 16   Wt 172 lb (78 kg)   LMP 05/01/2023   SpO2 100% Comment: room  air  BMI 28.62 kg/m    Physical Exam Vitals reviewed.  Constitutional:      General: She is not in acute distress.    Appearance: Normal appearance. She is well-developed. She is not diaphoretic.  HENT:     Head: Normocephalic and atraumatic.  Eyes:     General: No scleral icterus.    Conjunctiva/sclera: Conjunctivae normal.  Neck:     Thyroid: No thyromegaly.  Cardiovascular:     Rate and Rhythm: Normal rate and regular rhythm.     Pulses: Normal pulses.          Dorsalis pedis pulses are 2+ on the right side.       Posterior tibial pulses are 2+ on the right side and 2+ on the left side.     Heart sounds: Normal heart sounds. No murmur heard. Pulmonary:     Effort: Pulmonary effort is normal. No respiratory distress.     Breath sounds: Normal breath sounds. No wheezing, rhonchi or rales.  Musculoskeletal:     Cervical back: Neck supple.  Right lower leg: No edema.     Left lower leg: No edema.     Right foot: Normal range of motion. No deformity, bunion, Charcot foot or foot drop.     Left foot: Normal range of motion. No deformity, bunion, Charcot foot or foot drop.  Feet:     Right foot:     Protective Sensation: 8 sites tested.  8 sites sensed.     Skin integrity: Skin integrity normal. No ulcer, blister, erythema or warmth.     Toenail Condition: Right toenails are normal.     Left foot:     Protective Sensation: 8 sites tested.  8 sites sensed.     Skin integrity: Skin integrity normal. No ulcer, blister, erythema or warmth.     Toenail Condition: Left toenails are normal.  Lymphadenopathy:      Cervical: No cervical adenopathy.  Skin:    General: Skin is warm and dry.     Findings: No rash.  Neurological:     Mental Status: She is alert and oriented to person, place, and time. Mental status is at baseline.  Psychiatric:        Mood and Affect: Mood normal.        Behavior: Behavior normal.      No results found for any visits on 05/14/23.  Assessment & Plan     1. Uncontrolled type 2 diabetes mellitus with hyperglycemia (HCC) Chronic and currently stable - POCT Glucose (CBG) 170 - Ambulatory referral to Ophthalmology for DM eye exam - sitaGLIPtin (JANUVIA) 100 MG tablet; Take 1 tablet (100 mg total) by mouth daily.  Dispense: 30 tablet; Refill: 1 - Glucose Blood (BLOOD GLUCOSE TEST STRIPS) STRP; Use twice daily. May substitute to any manufacturer covered by patient's insurance.  Dispense: 70 each; Refill: 2 Continue glipizide 2.5 mg BID and Januvia, continue to measure her BS at home Recommended to adhere to low carb diet and daily exercise ADA website was advised for patient education Will Fu  2. Allergic reaction, subsequent encounter Resolved Was referred to [facility]. Pt was advised to contact clinic and schedule an appt Contact us if there will be any problems.   No follow-ups on file. Pt was recommended to FU with her PCP in 6 weeks.     The patient was advised to call back or seek an in-person evaluation if the symptoms worsen or if the condition fails to improve as anticipated.  I discussed the assessment and treatment plan with the patient. The patient was provided an opportunity to ask questions and all were answered. The patient agreed with the plan and demonstrated an understanding of the instructions.  I, Debera Lat, PA-C have reviewed all documentation for this visit. The documentation on  05/14/23  for the exam, diagnosis, procedures, and orders are all accurate and complete.  Debera Lat, [facility], MMS [facility] [phone] (phone) [phone] (fax)   [facility]

## 2023-05-16 NOTE — Progress Notes (Signed)
Letter already sent by another provider

## 2023-06-02 DIAGNOSIS — R300 Dysuria: Secondary | ICD-10-CM | POA: Diagnosis not present

## 2023-06-02 DIAGNOSIS — N390 Urinary tract infection, site not specified: Secondary | ICD-10-CM | POA: Diagnosis not present

## 2023-06-05 ENCOUNTER — Ambulatory Visit (INDEPENDENT_AMBULATORY_CARE_PROVIDER_SITE_OTHER): Payer: Medicaid Other

## 2023-06-05 ENCOUNTER — Encounter: Payer: Self-pay | Admitting: Obstetrics and Gynecology

## 2023-06-05 ENCOUNTER — Ambulatory Visit (INDEPENDENT_AMBULATORY_CARE_PROVIDER_SITE_OTHER): Payer: Medicaid Other | Admitting: Obstetrics and Gynecology

## 2023-06-05 VITALS — BP 110/60 | Ht 61.0 in | Wt 176.0 lb

## 2023-06-05 DIAGNOSIS — N83201 Unspecified ovarian cyst, right side: Secondary | ICD-10-CM | POA: Diagnosis not present

## 2023-06-05 DIAGNOSIS — N920 Excessive and frequent menstruation with regular cycle: Secondary | ICD-10-CM

## 2023-06-05 DIAGNOSIS — N83202 Unspecified ovarian cyst, left side: Secondary | ICD-10-CM | POA: Diagnosis not present

## 2023-06-05 DIAGNOSIS — N898 Other specified noninflammatory disorders of vagina: Secondary | ICD-10-CM

## 2023-06-05 DIAGNOSIS — Z3041 Encounter for surveillance of contraceptive pills: Secondary | ICD-10-CM

## 2023-06-05 MED ORDER — NORETHINDRONE 0.35 MG PO TABS
1.0000 | ORAL_TABLET | Freq: Every day | ORAL | 2 refills | Status: AC
Start: 2023-06-05 — End: ?

## 2023-06-05 MED ORDER — FLUCONAZOLE 150 MG PO TABS
150.0000 mg | ORAL_TABLET | Freq: Once | ORAL | 2 refills | Status: AC
Start: 2023-06-05 — End: 2023-06-05

## 2023-06-05 NOTE — Progress Notes (Signed)
Sherlyn Hay, DO   Chief Complaint  Patient presents with   Follow-up    U/S    HPI:      Samantha Bonilla is a 42 y.o. [id number] whose LMP was Patient's last menstrual period was 05/11/2023 (exact date)., presents today for f/u for menorrhagia from 3/24. Started on POPs. Pt states menses irregular (had 2 last month), lasting 8 days, lighter flow. Happy with OCPs. Treated for yeast vag with diflucan 3/24 with sx relief, pt would like Rx RF. Hx of DM and sugars not well-controlled.  Also with hx of bilat ovar cysts. Did repeat u/s today. Has occas RLQ pain, no LLQ sx.   03/06/23 NOTE: NP ED ref for heavier periods for 3 months, went to ED 03/03/23. Menses are monthly, now lasting 8 days, heavy flow changing products Q1-2 hrs, with quarter-sized clots, occas BTB/spotting only, mod to severe dysmen, not improved with ibup. Is having vasomtor sx 1 wk before periods now.  Menses used to be monthly, lasting 4-5 days, mod flow, no BTB, mild dysmen. Pt had Normal CBC 3/24 and bilat ovar cysts on GYN u/s, EM=8 mm (see results below). Pt was on OCPs years ago, none recently. No recent TSH done. No hx of HTN, DVTs but does have migraines with aura.  She is sexually active, s/p TL. Has had dyspareunia recently since heavier menses. No bleeding with sex.   Patient Active Problem List   Diagnosis Date Noted   Lip swelling 04/23/2023   Angular cheilitis 04/17/2023   Uncontrolled type 2 diabetes mellitus with hyperglycemia (HCC) 04/16/2023   Bilateral ovarian cysts 03/06/2023   Endocervical polyp 03/06/2023   Menorrhagia with regular cycle 03/06/2023    Past Surgical History:  Procedure Laterality Date   TUBAL LIGATION     WISDOM TOOTH EXTRACTION      History reviewed. No pertinent family history.  Social History   Socioeconomic History   Marital status: Married    Spouse name: Not on file   Number of children: Not on file   Years of education: Not on file   Highest  education level: Not on file  Occupational History   Not on file  Tobacco Use   Smoking status: Never   Smokeless tobacco: Never  Vaping Use   Vaping Use: Never used  Substance and Sexual Activity   Alcohol use: Not Currently   Drug use: Not Currently   Sexual activity: Yes    Birth control/protection: Surgical    Comment: Tubal Ligation  Other Topics Concern   Not on file  Social History Narrative   Not on file   Social Determinants of Health   Financial Resource Strain: Not on file  Food Insecurity: No Food Insecurity (04/24/2023)   Hunger Vital Sign    Worried About Running Out of Food in the Last Year: Never true    Ran Out of Food in the Last Year: Never true  Transportation Needs: No Transportation Needs (04/24/2023)   PRAPARE - Administrator, Civil Service (Medical): No    Lack of Transportation (Non-Medical): No  Physical Activity: Not on file  Stress: Not on file  Social Connections: Not on file  Intimate Partner Violence: Not At Risk (04/24/2023)   Humiliation, Afraid, Rape, and Kick questionnaire    Fear of Current or Ex-Partner: No    Emotionally Abused: No    Physically Abused: No    Sexually Abused: No    Outpatient  Medications Prior to Visit  Medication Sig Dispense Refill   ACCU-CHEK GUIDE test strip USE TWICE DAILY. MAY SUBSTITUTE TO ANY MANUFACTURER COVERED BY PATIENT'S INSURANCE. 70 strip 2   Blood Glucose Monitoring Suppl DEVI 1 each by Does not apply route in the morning, at noon, and at bedtime. May substitute to any manufacturer covered by patient's insurance. 1 each 0   EPINEPHrine 0.3 mg/0.3 mL IJ SOAJ injection Inject 0.3 mg into the muscle once as needed (SOB/ ANAPHYLAXIS / ANGIOEDEMA. / HYPERSENSITIVITY REACTION/). 1 each 0   glipiZIDE 2.5 MG TABS Take 2.5 mg by mouth 2 (two) times daily. 60 tablet 11   sitaGLIPtin (JANUVIA) 100 MG tablet Take 1 tablet (100 mg total) by mouth daily. 30 tablet 1   sulfamethoxazole-trimethoprim (BACTRIM  DS) 800-160 MG tablet Take 1 tablet by mouth 2 (two) times daily.     Lancet Device MISC 1 each by Does not apply route in the morning, at noon, and at bedtime. May substitute to any manufacturer covered by patient's insurance. 1 each 0   Lancets Misc. MISC 1 each by Does not apply route daily before breakfast. May substitute to any manufacturer covered by patient's insurance. 100 each 0   No facility-administered medications prior to visit.      ROS:  Review of Systems  Constitutional:  Negative for fever.  Gastrointestinal:  Negative for blood in stool, constipation, diarrhea, nausea and vomiting.  Genitourinary:  Positive for vaginal bleeding. Negative for dyspareunia, dysuria, flank pain, frequency, hematuria, urgency, vaginal discharge and vaginal pain.  Musculoskeletal:  Negative for back pain.  Skin:  Negative for rash.   BREAST: No symptoms   OBJECTIVE:   Vitals:  BP 110/60   Ht 5\' 1"  (1.549 m)   Wt 176 lb (79.8 kg)   LMP 05/11/2023 (Exact Date)   BMI 33.25 kg/m   Physical Exam Vitals reviewed.  Constitutional:      Appearance: She is well-developed.  Pulmonary:     Effort: Pulmonary effort is normal.  Musculoskeletal:        General: Normal range of motion.     Cervical back: Normal range of motion.  Skin:    General: Skin is warm and dry.  Neurological:     General: No focal deficit present.     Mental Status: She is alert and oriented to person, place, and time.     Cranial Nerves: No cranial nerve deficit.  Psychiatric:        Mood and Affect: Mood normal.        Behavior: Behavior normal.        Thought Content: Thought content normal.        Judgment: Judgment normal.     Results:  ULTRASOUND REPORT   Location: [facility] Date of Service: 06/05/2023       Indications:Pelvic Pain Findings:  The uterus is anteverted and measures 9.88 x 5.93 x 6.97 cm . Echo texture is homogenous without evidence of focal masses.     The  Endometrium measures 8.04 mm.   Right Ovary measures 2.78 x 1.16 x 1.81 cm. It is normal in appearance. Left Ovary measures 5.08 x 3.59 x 4.16 cm. It is not normal in appearance. Lt ovarian complex cyst seen measuring ; 2.91 x 3.09 x 2.14cm Survey of the adnexa demonstrates no adnexal masses. There is no free fluid in the cul de sac.   Impression: 1. Lt ovarian complex cyst seen 2. Rt ovary WNL 3.  Uterus WNL   Recommendations: 1.Clinical correlation with the patient's History and Physical Exam.  Assessment/Plan: Menorrhagia with regular cycle - Plan: norethindrone (MICRONOR) 0.35 MG tablet; menses improved with POPs. Discussed giving it more time for cycle control vs changing to aygestin. Will cont current Rx for now. F/u prn.   Encounter for surveillance of contraceptive pills - Plan: norethindrone (MICRONOR) 0.35 MG tablet  Vaginal itching - Plan: fluconazole (DIFLUCAN) 150 MG tablet; Rx RF for yeast vag sx prn.   Bilateral ovarian cysts--RTO cyst resolved, still has LTO cyst, no pain. No f/u needed.    Meds ordered this encounter  Medications   norethindrone (MICRONOR) 0.35 MG tablet    Sig: Take 1 tablet (0.35 mg total) by mouth daily.    Dispense:  84 tablet    Refill:  2    Order Specific Question:   Supervising Provider    Answer:   Hildred Laser [[id number]]   fluconazole (DIFLUCAN) 150 MG tablet    Sig: Take 1 tablet (150 mg total) by mouth once for 1 dose.    Dispense:  1 tablet    Refill:  2    Order Specific Question:   Supervising Provider    Answer:   Hildred Laser [[id number]]      Return in about 9 months (around 03/04/2024) for annual.  [place] Sorn B. Nykia Turko, PA-C 06/05/2023 3:22 PM

## 2023-06-08 ENCOUNTER — Other Ambulatory Visit: Payer: Self-pay | Admitting: Family Medicine

## 2023-06-08 DIAGNOSIS — E1165 Type 2 diabetes mellitus with hyperglycemia: Secondary | ICD-10-CM

## 2023-06-25 ENCOUNTER — Encounter: Payer: Self-pay | Admitting: Family Medicine

## 2023-06-25 ENCOUNTER — Telehealth: Payer: Self-pay | Admitting: Family Medicine

## 2023-06-25 ENCOUNTER — Ambulatory Visit (INDEPENDENT_AMBULATORY_CARE_PROVIDER_SITE_OTHER): Payer: Medicaid Other | Admitting: Family Medicine

## 2023-06-25 VITALS — BP 116/71 | HR 81 | Ht 64.0 in | Wt 175.4 lb

## 2023-06-25 DIAGNOSIS — M722 Plantar fascial fibromatosis: Secondary | ICD-10-CM

## 2023-06-25 DIAGNOSIS — E160 Drug-induced hypoglycemia without coma: Secondary | ICD-10-CM

## 2023-06-25 DIAGNOSIS — E1165 Type 2 diabetes mellitus with hyperglycemia: Secondary | ICD-10-CM

## 2023-06-25 DIAGNOSIS — T383X1A Poisoning by insulin and oral hypoglycemic [antidiabetic] drugs, accidental (unintentional), initial encounter: Secondary | ICD-10-CM

## 2023-06-25 MED ORDER — EMPAGLIFLOZIN 10 MG PO TABS
10.0000 mg | ORAL_TABLET | Freq: Every day | ORAL | 1 refills | Status: DC
Start: 2023-06-25 — End: 2023-07-09

## 2023-06-25 MED ORDER — LISINOPRIL 2.5 MG PO TABS: 2.5000 mg | ORAL_TABLET | Freq: Every day | ORAL | 1 refills | Status: DC

## 2023-06-25 MED ORDER — ROSUVASTATIN CALCIUM 5 MG PO TABS
5.0000 mg | ORAL_TABLET | Freq: Every day | ORAL | 3 refills | Status: AC
Start: 2023-06-25 — End: ?

## 2023-06-25 NOTE — Progress Notes (Signed)
Established patient visit   Patient: Samantha Bonilla   DOB: 1981-05-07   42 y.o. Female  MRN: [id number] Visit Date: 06/25/2023  Today's healthcare provider: Sherlyn Hay, DO   Chief Complaint  Patient presents with   Medical Management of Chronic Issues    Patient is present to f/u on DM II. She reports ti take all medication as prescribed. She reports checking her blood sugar gets reading from 120-146 fasting. She reports she does feel it is low at times and she will feel cold w/shakes. She reports symptoms visual changes, and pain in her feet.   Patient also reports sharp pain in her heel daily X 1 month and would like it to be checked  Patient is accompanied by her daughter who is listed on DPR and interpreting   Subjective    HPI HPI     Medical Management of Chronic Issues    Additional comments: Patient is present to f/u on DM II. She reports ti take all medication as prescribed. She reports checking her blood sugar gets reading from 120-146 fasting. She reports she does feel it is low at times and she will feel cold w/shakes. She reports symptoms visual changes, and pain in her feet.   Patient also reports sharp pain in her heel daily X 1 month and would like it to be checked  Patient is accompanied by her daughter who is listed on DPR and interpreting      Last edited by Sherlyn Hay, DO on 06/25/2023  2:52 PM.    Patient refused Spanish-speaking interpreter services and wanted her daughter to interpret.   Eye exam? Scheduled for 07/15/2023 She feels as though her blood sugar drops on a daily basis.  She has not been able to check to verify that it is low because she is typically at work; she just eats something.  - The episodes are sometimes bad enough that feels she is going to pass out and her body gets shaky.  Heel hurts when she walks, has to put it sideways when it hurts.  Stabbing type pain, just on right heel.  Wears steel-toed boots at work.   She bought inserts but they are not helping  - works 8 hour shifts  - wears flip-flops at home  - it hurts more when she has been relaxed for a while and she gets up, it hurts more.  - taking tylenol; it does help    The 10-year ASCVD risk score (Arnett DK, et al., 2019) is: 0.6%   Values used to calculate the score:     Age: 50 years     Sex: Female     Is Non-Hispanic African American: No     Diabetic: Yes     Tobacco smoker: No     Systolic Blood Pressure: 116 mmHg     Is BP treated: No     HDL Cholesterol: 53 mg/dL     Total Cholesterol: 143 mg/dL   Medications: Outpatient Medications Prior to Visit  Medication Sig Note   ACCU-CHEK GUIDE test strip USE TWICE DAILY. MAY SUBSTITUTE TO ANY MANUFACTURER COVERED BY PATIENT'S INSURANCE.    Blood Glucose Monitoring Suppl DEVI 1 each by Does not apply route in the morning, at noon, and at bedtime. May substitute to any manufacturer covered by patient's insurance.    EPINEPHrine 0.3 mg/0.3 mL IJ SOAJ injection Inject 0.3 mg into the muscle once as needed (SOB/ ANAPHYLAXIS /  ANGIOEDEMA. / HYPERSENSITIVITY REACTION/).    norethindrone (MICRONOR) 0.35 MG tablet Take 1 tablet (0.35 mg total) by mouth daily.    sitaGLIPtin (JANUVIA) 100 MG tablet Take 1 tablet (100 mg total) by mouth daily.    sulfamethoxazole-trimethoprim (BACTRIM DS) 800-160 MG tablet Take 1 tablet by mouth 2 (two) times daily.    [DISCONTINUED] glipiZIDE 2.5 MG TABS Take 2.5 mg by mouth 2 (two) times daily. 06/25/2023: hypoglycemia   Lancet Device MISC 1 each by Does not apply route in the morning, at noon, and at bedtime. May substitute to any manufacturer covered by patient's insurance.    Lancets Misc. MISC 1 each by Does not apply route daily before breakfast. May substitute to any manufacturer covered by patient's insurance.    No facility-administered medications prior to visit.    Review of Systems  Constitutional:  Negative for appetite change, chills, fatigue  and fever.  Respiratory:  Negative for chest tightness and shortness of breath.   Cardiovascular:  Negative for chest pain and palpitations.  Gastrointestinal:  Negative for abdominal pain, nausea and vomiting.  Musculoskeletal:  Positive for arthralgias (foot).  Skin:  Negative for color change, pallor, rash and wound.  Neurological:  Negative for dizziness, weakness and numbness.       Objective    BP 116/71 (BP Location: Right Arm, Patient Position: Sitting, Cuff Size: Normal)   Pulse 81   Ht 5\' 4"  (1.626 m)   Wt 175 lb 6.4 oz (79.6 kg)   LMP 05/11/2023 (Exact Date)   SpO2 100%   BMI 30.11 kg/m    Physical Exam Vitals and nursing note reviewed.  Constitutional:      General: She is not in acute distress.    Appearance: Normal appearance.  HENT:     Head: Normocephalic and atraumatic.  Eyes:     General: No scleral icterus.    Conjunctiva/sclera: Conjunctivae normal.  Cardiovascular:     Rate and Rhythm: Normal rate.     Pulses:          Dorsalis pedis pulses are 2+ on the right side and 2+ on the left side.       Posterior tibial pulses are 2+ on the right side and 2+ on the left side.  Pulmonary:     Effort: Pulmonary effort is normal.  Musculoskeletal:     Right foot: Normal range of motion. No deformity, bunion, Charcot foot, foot drop or prominent metatarsal heads.     Left foot: Normal range of motion. No deformity, bunion, Charcot foot, foot drop or prominent metatarsal heads.  Feet:     Right foot:     Protective Sensation: 10 sites tested.  10 sites sensed.     Skin integrity: Skin integrity normal.     Toenail Condition: Right toenails are normal.     Left foot:     Protective Sensation: 10 sites tested.  10 sites sensed.     Skin integrity: Skin integrity normal.     Toenail Condition: Left toenails are normal.     Comments: Tender at anteromedial heel of right foot Neurological:     Mental Status: She is alert and oriented to person, place, and time.  Mental status is at baseline.  Psychiatric:        Mood and Affect: Mood normal.        Behavior: Behavior normal.      No results found for any visits on 06/25/23.  Assessment & Plan  1. Uncontrolled type 2 diabetes mellitus with hyperglycemia (HCC) Patient's fasting blood sugars are only improved.  Unfortunately, she is reporting daily episodes of hypoglycemia symptoms.  Pulled up patient's insurance formulary to find which medications are covered options for her.  Discussed options, including Ozempic and empagliflozin.  Through shared decision making, decided to stop glipizide and start empagliflozin.  Gave patient 14 days worth of sample empagliflozin.       - Will also start patient on lisinopril and rosuvastatin as noted below for renal protection.     - Ordered hemoglobin A1c and advised patient and her daughter that it should not be drawn prior to 07/24/2023, as that is the 79-month mark from when the prior level was drawn. - empagliflozin (JARDIANCE) 10 MG TABS tablet; Take 1 tablet (10 mg total) by mouth daily before breakfast.  Dispense: 30 tablet; Refill: 1 - lisinopril (ZESTRIL) 2.5 MG tablet; Take 1 tablet (2.5 mg total) by mouth daily.  Dispense: 90 tablet; Refill: 1 - rosuvastatin (CRESTOR) 5 MG tablet; Take 1 tablet (5 mg total) by mouth daily.  Dispense: 90 tablet; Refill: 3 - Hemoglobin A1c  2. Hypoglycemia secondary to sulfonylurea, accidental or unintentional, initial encounter As above.  3. Plantar fasciitis, right Patient has been experiencing pain at the anterior medial aspect of her heel for the past month.  It is worse after getting up from sitting or laying down.  She has taken Tylenol with relief.  Will have her continue Tylenol for pain relief and counseled her on using a frozen water bottle to reduce inflammation and stretch the area.  Also included patient education regarding planta fasciitis on the patient's AVS.   Return in about 4 months (around 10/26/2023)  for DM.      I discussed the assessment and treatment plan with the patient  The patient was provided an opportunity to ask questions and all were answered. The patient agreed with the plan and demonstrated an understanding of the instructions.   The patient was advised to call back or seek an in-person evaluation if the symptoms worsen or if the condition fails to improve as anticipated.  Of note, patient's daughter did have some difficulty interpreting due to her unfamiliarity with how to convey medical information.  This was emphasized to the patient, but the patient still chose to have her daughter interpret.  This did create some delay in appropriate relay of information.  Total time was 60 minutes. That includes chart review before the visit, the actual patient visit, and time spent on documentation after the visit.     Sherlyn Hay, DO  [facility] [phone] (phone) [phone] (fax)  [facility]

## 2023-06-25 NOTE — Telephone Encounter (Signed)
Pharmacy requiring prior auth  [employer] [id number] Onalee Hua 1981-03-07

## 2023-07-01 ENCOUNTER — Encounter: Payer: Self-pay | Admitting: Family Medicine

## 2023-07-08 DIAGNOSIS — M722 Plantar fascial fibromatosis: Secondary | ICD-10-CM | POA: Diagnosis not present

## 2023-07-09 ENCOUNTER — Other Ambulatory Visit: Payer: Self-pay

## 2023-07-09 ENCOUNTER — Encounter: Payer: Self-pay | Admitting: Family Medicine

## 2023-07-09 ENCOUNTER — Ambulatory Visit (INDEPENDENT_AMBULATORY_CARE_PROVIDER_SITE_OTHER): Payer: Medicaid Other | Admitting: Family Medicine

## 2023-07-09 VITALS — BP 109/70 | HR 87 | Ht 64.0 in | Wt 175.4 lb

## 2023-07-09 DIAGNOSIS — E1165 Type 2 diabetes mellitus with hyperglycemia: Secondary | ICD-10-CM

## 2023-07-09 DIAGNOSIS — M79671 Pain in right foot: Secondary | ICD-10-CM | POA: Diagnosis not present

## 2023-07-09 MED ORDER — TRAMADOL HCL 50 MG PO TABS
50.0000 mg | ORAL_TABLET | Freq: Three times a day (TID) | ORAL | 0 refills | Status: AC | PRN
Start: 2023-07-09 — End: 2023-07-14

## 2023-07-09 MED ORDER — EMPAGLIFLOZIN 10 MG PO TABS
10.0000 mg | ORAL_TABLET | Freq: Every day | ORAL | 1 refills | Status: DC
Start: 2023-07-09 — End: 2023-09-05

## 2023-07-09 MED ORDER — GABAPENTIN 100 MG PO CAPS
100.0000 mg | ORAL_CAPSULE | Freq: Three times a day (TID) | ORAL | 0 refills | Status: AC
Start: 2023-07-09 — End: ?

## 2023-07-09 NOTE — Patient Instructions (Signed)
Expect a phone call from podiatry

## 2023-07-09 NOTE — Telephone Encounter (Signed)
Patient advised.

## 2023-07-09 NOTE — Progress Notes (Signed)
Established patient visit   Patient: Samantha Bonilla   DOB: 1981/09/12   42 y.o. Female  MRN: [id number] Visit Date: 07/09/2023  Today's healthcare provider: Jacky Kindle, FNP  Introduced to nurse practitioner role and practice setting.  All questions answered.  Discussed provider/patient relationship and expectations.  Video Interpreter Merlin [phone]  Chief Complaint  Patient presents with   Leg Pain    Patient is present for leg pain that began one month ago. She reports beginning Friday it has been more constant. She was seen by ortho yesterday and was given injection and rx. She reports that she is not feeling any better since leaving and it is still pretty constant. Patient reports taking rx this moring.    Subjective    HPI HPI     Leg Pain    Additional comments: Patient is present for leg pain that began one month ago. She reports beginning Friday it has been more constant. She was seen by ortho yesterday and was given injection and rx. She reports that she is not feeling any better since leaving and it is still pretty constant. Patient reports taking rx this moring.         Comments   Interpreter Joaquin [phone]      Last edited by Acey Lav, CMA on 07/09/2023  3:17 PM.      Medications: Outpatient Medications Prior to Visit  Medication Sig   ACCU-CHEK GUIDE test strip USE TWICE DAILY. MAY SUBSTITUTE TO ANY MANUFACTURER COVERED BY PATIENT'S INSURANCE.   Blood Glucose Monitoring Suppl DEVI 1 each by Does not apply route in the morning, at noon, and at bedtime. May substitute to any manufacturer covered by patient's insurance.   empagliflozin (JARDIANCE) 10 MG TABS tablet Take 1 tablet (10 mg total) by mouth daily before breakfast.   EPINEPHrine 0.3 mg/0.3 mL IJ SOAJ injection Inject 0.3 mg into the muscle once as needed (SOB/ ANAPHYLAXIS / ANGIOEDEMA. / HYPERSENSITIVITY REACTION/).   lisinopril (ZESTRIL) 2.5 MG tablet Take 1 tablet (2.5 mg  total) by mouth daily.   meloxicam (MOBIC) 7.5 MG tablet Take 7.5 mg by mouth daily.   norethindrone (MICRONOR) 0.35 MG tablet Take 1 tablet (0.35 mg total) by mouth daily.   rosuvastatin (CRESTOR) 5 MG tablet Take 1 tablet (5 mg total) by mouth daily.   sitaGLIPtin (JANUVIA) 100 MG tablet Take 1 tablet (100 mg total) by mouth daily.   sulfamethoxazole-trimethoprim (BACTRIM DS) 800-160 MG tablet Take 1 tablet by mouth 2 (two) times daily.   Lancet Device MISC 1 each by Does not apply route in the morning, at noon, and at bedtime. May substitute to any manufacturer covered by patient's insurance.   Lancets Misc. MISC 1 each by Does not apply route daily before breakfast. May substitute to any manufacturer covered by patient's insurance.   No facility-administered medications prior to visit.      Objective    BP 109/70 (BP Location: Right Arm, Patient Position: Sitting, Cuff Size: Large)   Pulse 87   Ht 5\' 4"  (1.626 m)   Wt 175 lb 6.4 oz (79.6 kg)   SpO2 100%   BMI 30.11 kg/m   Physical Exam Vitals and nursing note reviewed.  Constitutional:      General: She is not in acute distress.    Appearance: Normal appearance. She is obese. She is not ill-appearing, toxic-appearing or diaphoretic.  HENT:     Head: Normocephalic and atraumatic.  Cardiovascular:  Rate and Rhythm: Normal rate and regular rhythm.     Pulses: Normal pulses.          Dorsalis pedis pulses are 2+ on the right side.       Posterior tibial pulses are 2+ on the right side.     Heart sounds: Normal heart sounds. No murmur heard.    No friction rub. No gallop.  Pulmonary:     Effort: Pulmonary effort is normal. No respiratory distress.     Breath sounds: Normal breath sounds. No stridor. No wheezing, rhonchi or rales.  Chest:     Chest wall: No tenderness.  Musculoskeletal:        General: No swelling, tenderness, deformity or signs of injury. Normal range of motion.     Right lower leg: No edema.     Left  lower leg: No edema.       Feet:  Feet:     Right foot:     Protective Sensation: 10 sites tested.  10 sites sensed.     Skin integrity: Skin integrity normal.     Toenail Condition: Right toenails are normal.  Skin:    General: Skin is warm and dry.     Capillary Refill: Capillary refill takes less than 2 seconds.     Coloration: Skin is not jaundiced or pale.     Findings: No bruising, erythema, lesion or rash.  Neurological:     General: No focal deficit present.     Mental Status: She is alert and oriented to person, place, and time. Mental status is at baseline.     Cranial Nerves: No cranial nerve deficit.     Sensory: No sensory deficit.     Motor: No weakness.     Coordination: Coordination normal.  Psychiatric:        Mood and Affect: Mood normal.        Behavior: Behavior normal.        Thought Content: Thought content normal.        Judgment: Judgment normal.      No results found for any visits on 07/09/23.  Assessment & Plan     Problem List Items Addressed This Visit       Endocrine   Uncontrolled type 2 diabetes mellitus with hyperglycemia (HCC)    Chronic, remains uncontrolled Goal of A1c <7 Continue to recommend balanced, lower carb meals. Smaller meal size, adding snacks. Choosing water as drink of choice and increasing purposeful exercise. Encouraged to work to establish diet and exercise to assist with DM given concern for progression and possible neuropathy  C/o heel/foot pain- referral to podiatry to assist F/u DM with PCP        Other   Pain of right heel - Primary    Chronic, has seen ortho and not improved with steroid Pt is obese Body mass index is 30.11 kg/m. And has Dx of DM; could possibly been neuropathy given A1c is uncontrolled Recommend consult with podiatry and use of gaba and tramadol to assist with pain       Relevant Orders   Ambulatory referral to Podiatry   Return if symptoms worsen or fail to improve.     Leilani Merl, FNP, have reviewed all documentation for this visit. The documentation on 07/16/23 for the exam, diagnosis, procedures, and orders are all accurate and complete.  Addressed extensive list of chronic and acute medical problems today requiring 30 minutes reviewing her medical record, counseling  patient regarding her conditions and coordination of care.     Jacky Kindle, FNP  [facility] [phone] (phone) [phone] (fax)  [facility]

## 2023-07-09 NOTE — Telephone Encounter (Signed)
Pt's daughter called for update on PA, made aware that PA has been approved. Daughter reports that the patient is completely out of her current supply.

## 2023-07-16 DIAGNOSIS — M79671 Pain in right foot: Secondary | ICD-10-CM | POA: Insufficient documentation

## 2023-07-16 NOTE — Assessment & Plan Note (Signed)
Chronic, remains uncontrolled Goal of A1c <7 Continue to recommend balanced, lower carb meals. Smaller meal size, adding snacks. Choosing water as drink of choice and increasing purposeful exercise. Encouraged to work to establish diet and exercise to assist with DM given concern for progression and possible neuropathy  C/o heel/foot pain- referral to podiatry to assist F/u DM with PCP

## 2023-07-16 NOTE — Assessment & Plan Note (Signed)
Chronic, has seen ortho and not improved with steroid Pt is obese Body mass index is 30.11 kg/m. And has Dx of DM; could possibly been neuropathy given A1c is uncontrolled Recommend consult with podiatry and use of gaba and tramadol to assist with pain

## 2023-07-18 ENCOUNTER — Telehealth: Payer: Self-pay | Admitting: Family Medicine

## 2023-07-18 NOTE — Telephone Encounter (Signed)
Covermymeds is requesting prior authorization Key: [id number] Name: Samantha Bonilla 10mg  tablets has been rejected and requires PA

## 2023-07-18 NOTE — Telephone Encounter (Signed)
FMLA paperwork was received on 07/17/2023 & placed in provider's mailbox. Please allow 7-10 business days to be completed. Thank you

## 2023-07-18 NOTE — Telephone Encounter (Signed)
PA initiated

## 2023-07-18 NOTE — Telephone Encounter (Signed)
Outcome Additional Information Required Prior Authorization Not Required  Called the pharmacy and per pharmacy on their end is saying is to soon to be filled.

## 2023-07-20 ENCOUNTER — Other Ambulatory Visit: Payer: Self-pay | Admitting: Physician Assistant

## 2023-07-20 DIAGNOSIS — E1165 Type 2 diabetes mellitus with hyperglycemia: Secondary | ICD-10-CM

## 2023-07-21 NOTE — Telephone Encounter (Signed)
Requested Prescriptions  Pending Prescriptions Disp Refills   sitaGLIPtin (JANUVIA) 100 MG tablet [Pharmacy Med Name: JANUVIA 100 MG TABLET] 90 tablet 0    Sig: TAKE 1 TABLET BY MOUTH EVERY DAY     Endocrinology:  Diabetes - DPP-4 Inhibitors Failed - 07/20/2023  9:28 AM      Failed - HBA1C is between 0 and 7.9 and within 180 days    Hgb A1c MFr Bld  Date Value Ref Range Status  04/23/2023 10.5 (H) 4.8 - 5.6 % Final    Comment:    (NOTE) Pre diabetes:          5.7%-6.4%  Diabetes:              >6.4%  Glycemic control for   <7.0% adults with diabetes          Passed - Cr in normal range and within 360 days    Creatinine, Ser  Date Value Ref Range Status  04/25/2023 0.86 0.57 - 1.00 mg/dL Final         Passed - Valid encounter within last 6 months    Recent Outpatient Visits           1 week ago Pain of right heel   [facility] Merita Norton T, FNP   3 weeks ago Uncontrolled type 2 diabetes mellitus with hyperglycemia [facility])   [place] [facility] [place], Monico Blitz, DO   2 months ago Uncontrolled type 2 diabetes mellitus with hyperglycemia [facility])   [place] [facility] [place], Olivarez, PA-C   2 months ago Uncontrolled type 2 diabetes mellitus with hyperglycemia [facility])   [place] [facility] [place], Idanha, PA-C   3 months ago Uncontrolled type 2 diabetes mellitus with hyperglycemia [facility])   [facility] [place], Monico Blitz, DO

## 2023-07-25 NOTE — Telephone Encounter (Signed)
Pt' daughter called asking if the office would call her when the paperwork is done.  CB#  [phone]

## 2023-07-28 NOTE — Telephone Encounter (Signed)
Dr. Payton Mccallum do you know what paper work they are referring to?  About the Jardiance requiring PA when I submitted the PA it said PA not required and called the pharmacy 08/02 and per pharmacist on their end no PA needed and it was too soon to be filled.  I am not sure if they are referring to the same thing.  Just an FYI I called the patient and they are asking if the FMLA  form is ready.

## 2023-08-04 DIAGNOSIS — E1165 Type 2 diabetes mellitus with hyperglycemia: Secondary | ICD-10-CM | POA: Diagnosis not present

## 2023-08-04 DIAGNOSIS — H52223 Regular astigmatism, bilateral: Secondary | ICD-10-CM | POA: Diagnosis not present

## 2023-08-04 LAB — HM DIABETES EYE EXAM

## 2023-08-05 DIAGNOSIS — H5213 Myopia, bilateral: Secondary | ICD-10-CM | POA: Diagnosis not present

## 2023-08-12 ENCOUNTER — Telehealth: Payer: Self-pay

## 2023-08-12 NOTE — Telephone Encounter (Signed)
Called patient to go over results. Couldn't leave a VM due to patient Mailbox being full

## 2023-08-12 NOTE — Telephone Encounter (Signed)
-----   Message from [facility] sent at 08/05/2023 12:23 PM EDT ----- Please verify that patient receives the message, as it requires her daughter to help her (Spanish-speaking patient).  ___________________________________________________________________________ Samantha Bonilla,  Your hemoglobin A1c level, which reflects your average blood sugar over the past 30 days, is back and is significantly improved at 7.3.  We want to continue to work to get it under 7.  Lets continue the medications we have you on (since we just started the empagliflozin (Jardiance)), and we will recheck your level in 3 months.  Let us know if you have any questions or concerns.  Take care, Dr. Payton Mccallum

## 2023-09-05 ENCOUNTER — Ambulatory Visit (INDEPENDENT_AMBULATORY_CARE_PROVIDER_SITE_OTHER): Payer: Medicaid Other | Admitting: Family Medicine

## 2023-09-05 VITALS — BP 118/79 | HR 78 | Ht 64.0 in | Wt 172.6 lb

## 2023-09-05 DIAGNOSIS — Z7984 Long term (current) use of oral hypoglycemic drugs: Secondary | ICD-10-CM | POA: Diagnosis not present

## 2023-09-05 DIAGNOSIS — I152 Hypertension secondary to endocrine disorders: Secondary | ICD-10-CM | POA: Insufficient documentation

## 2023-09-05 DIAGNOSIS — J302 Other seasonal allergic rhinitis: Secondary | ICD-10-CM | POA: Insufficient documentation

## 2023-09-05 DIAGNOSIS — Z87892 Personal history of anaphylaxis: Secondary | ICD-10-CM | POA: Insufficient documentation

## 2023-09-05 DIAGNOSIS — E1159 Type 2 diabetes mellitus with other circulatory complications: Secondary | ICD-10-CM

## 2023-09-05 DIAGNOSIS — T7840XA Allergy, unspecified, initial encounter: Secondary | ICD-10-CM | POA: Insufficient documentation

## 2023-09-05 DIAGNOSIS — E1165 Type 2 diabetes mellitus with hyperglycemia: Secondary | ICD-10-CM | POA: Diagnosis not present

## 2023-09-05 DIAGNOSIS — T7840XS Allergy, unspecified, sequela: Secondary | ICD-10-CM

## 2023-09-05 MED ORDER — PREDNISONE 50 MG PO TABS: 50.0000 mg | ORAL_TABLET | Freq: Every day | ORAL | 0 refills | Status: AC

## 2023-09-05 MED ORDER — AMLODIPINE BESYLATE 5 MG PO TABS: ORAL_TABLET | ORAL | 0 refills | Status: DC

## 2023-09-05 MED ORDER — EMPAGLIFLOZIN 10 MG PO TABS: 10.0000 mg | ORAL_TABLET | Freq: Every day | ORAL | 3 refills | Status: AC

## 2023-09-05 MED ORDER — TRIAMCINOLONE ACETONIDE 0.5 % EX OINT: 1.0000 | TOPICAL_OINTMENT | Freq: Two times a day (BID) | CUTANEOUS | 0 refills | Status: AC

## 2023-09-05 NOTE — Assessment & Plan Note (Signed)
Acute on chronic, recurrent since May Referral placed Encourage daily antihistamine Recommend topical steroid to assist with inflammation Stop lisinopril

## 2023-09-05 NOTE — Progress Notes (Signed)
Established patient visit   Patient: Samantha Bonilla   DOB: [birth year]   42 y.o. Female  MRN: [id number] Visit Date: 09/05/2023  Today's healthcare provider: Jacky Kindle, FNP  Introduced to nurse practitioner role and practice setting.  All questions answered.  Discussed provider/patient relationship and expectations.  Jenifer # [phone]-- Remote interpreter used to assist language barrier  Subjective    HPI HPI     Follow-up    Additional comments: Past 2 wks allergies and getting worst skin feels very itchy since May due to an allergic reaction      Last edited by Clois Comber on 09/05/2023  2:10 PM.      Medications: Outpatient Medications Prior to Visit  Medication Sig   ACCU-CHEK GUIDE test strip USE TWICE DAILY. MAY SUBSTITUTE TO ANY MANUFACTURER COVERED BY PATIENT'S INSURANCE.   Blood Glucose Monitoring Suppl DEVI 1 each by Does not apply route in the morning, at noon, and at bedtime. May substitute to any manufacturer covered by patient's insurance.   EPINEPHrine 0.3 mg/0.3 mL IJ SOAJ injection Inject 0.3 mg into the muscle once as needed (SOB/ ANAPHYLAXIS / ANGIOEDEMA. / HYPERSENSITIVITY REACTION/).   gabapentin (NEURONTIN) 100 MG capsule Take 1 capsule (100 mg total) by mouth 3 (three) times daily.   lisinopril (ZESTRIL) 2.5 MG tablet Take 1 tablet (2.5 mg total) by mouth daily.   loratadine (CLARITIN) 10 MG tablet Take 10 mg by mouth daily as needed for allergies.   meloxicam (MOBIC) 7.5 MG tablet Take 7.5 mg by mouth daily.   norethindrone (MICRONOR) 0.35 MG tablet Take 1 tablet (0.35 mg total) by mouth daily.   rosuvastatin (CRESTOR) 5 MG tablet Take 1 tablet (5 mg total) by mouth daily.   sitaGLIPtin (JANUVIA) 100 MG tablet TAKE 1 TABLET BY MOUTH EVERY DAY   sulfamethoxazole-trimethoprim (BACTRIM DS) 800-160 MG tablet Take 1 tablet by mouth 2 (two) times daily.   [DISCONTINUED] empagliflozin (JARDIANCE) 10 MG TABS tablet Take 1 tablet (10 mg total)  by mouth daily before breakfast.   Lancet Device MISC 1 each by Does not apply route in the morning, at noon, and at bedtime. May substitute to any manufacturer covered by patient's insurance.   Lancets Misc. MISC 1 each by Does not apply route daily before breakfast. May substitute to any manufacturer covered by patient's insurance.   No facility-administered medications prior to visit.     Objective    BP 118/79 (BP Location: Right Arm, Patient Position: Sitting, Cuff Size: Normal)   Pulse 78   Ht 5\' 4"  (1.626 m)   Wt 172 lb 9.6 oz (78.3 kg)   SpO2 100%   BMI 29.63 kg/m   Physical Exam Vitals and nursing note reviewed.  Constitutional:      General: She is not in acute distress.    Appearance: Normal appearance. She is overweight. She is not ill-appearing, toxic-appearing or diaphoretic.  HENT:     Head: Normocephalic and atraumatic.  Cardiovascular:     Rate and Rhythm: Normal rate and regular rhythm.     Pulses: Normal pulses.     Heart sounds: Normal heart sounds. No murmur heard.    No friction rub. No gallop.  Pulmonary:     Effort: Pulmonary effort is normal. No respiratory distress.     Breath sounds: Normal breath sounds. No stridor. No wheezing, rhonchi or rales.  Chest:     Chest wall: No tenderness.  Musculoskeletal:  General: No swelling, tenderness, deformity or signs of injury. Normal range of motion.     Right lower leg: No edema.     Left lower leg: No edema.  Skin:    General: Skin is warm and dry.     Capillary Refill: Capillary refill takes less than 2 seconds.     Coloration: Skin is not jaundiced or pale.     Findings: No bruising, erythema, lesion or rash.  Neurological:     General: No focal deficit present.     Mental Status: She is alert and oriented to person, place, and time. Mental status is at baseline.     Cranial Nerves: No cranial nerve deficit.     Sensory: No sensory deficit.     Motor: No weakness.     Coordination:  Coordination normal.  Psychiatric:        Mood and Affect: Mood normal.        Behavior: Behavior normal.        Thought Content: Thought content normal.        Judgment: Judgment normal.     No results found for any visits on 09/05/23.  Assessment & Plan     Problem List Items Addressed This Visit       Cardiovascular and Mediastinum   Hypertension associated with diabetes (HCC)    Chronic, close to control Goal of 129/79 Stop lisinopril and transition to Norvasc given recurrent facial swelling and difficulty breathing Denies new soaps, detergents, foods etc Normal exam at this time F/u with PCP      Relevant Medications   amLODipine (NORVASC) 5 MG tablet   empagliflozin (JARDIANCE) 10 MG TABS tablet     Endocrine   Uncontrolled type 2 diabetes mellitus with hyperglycemia (HCC)    Chronic, request for refills Last A1c improved from 10s to 7.3 Continue to recommend balanced, lower carb meals. Smaller meal size, adding snacks. Choosing water as drink of choice and increasing purposeful exercise.       Relevant Medications   empagliflozin (JARDIANCE) 10 MG TABS tablet     Other   Allergies - Primary    Acute on chronic, recurrent since May Referral placed Encourage daily antihistamine Recommend topical steroid to assist with inflammation Stop lisinopril       Relevant Orders   Ambulatory referral to Allergy   History of anaphylaxis   Relevant Orders   Ambulatory referral to Allergy   Seasonal allergies    Acute on chronic, worsening Encourage claritin daily and not PRN      Relevant Orders   Ambulatory referral to Allergy   Return in about 2 weeks (around 09/19/2023), or if symptoms worsen or fail to improve, for HTN management.     Leilani Merl, FNP, have reviewed all documentation for this visit. The documentation on 09/05/23 for the exam, diagnosis, procedures, and orders are all accurate and complete.  Jacky Kindle, FNP  [facility] [phone] (phone) [phone] (fax)  [facility]

## 2023-09-05 NOTE — Assessment & Plan Note (Signed)
Chronic, close to control Goal of 129/79 Stop lisinopril and transition to Norvasc given recurrent facial swelling and difficulty breathing Denies new soaps, detergents, foods etc Normal exam at this time F/u with PCP

## 2023-09-05 NOTE — Assessment & Plan Note (Signed)
Acute on chronic, worsening Encourage claritin daily and not PRN

## 2023-09-05 NOTE — Assessment & Plan Note (Signed)
Chronic, request for refills Last A1c improved from 10s to 7.3 Continue to recommend balanced, lower carb meals. Smaller meal size, adding snacks. Choosing water as drink of choice and increasing purposeful exercise.

## 2023-09-15 NOTE — Progress Notes (Unsigned)
Established patient visit  Patient: Samantha Bonilla   DOB: [birth year]   42 y.o. Female  MRN: [id number] Visit Date: 09/16/2023  Today's healthcare provider: Debera Lat, PA-C   No chief complaint on file.  Subjective    HPI  *** Discussed the use of AI scribe software for clinical note transcription with the patient, who gave verbal consent to proceed.  History of Present Illness               06/25/2023    2:37 PM 04/16/2023    3:49 PM  Depression screen PHQ 2/9  Decreased Interest 0 0  Down, Depressed, Hopeless 0 0  PHQ - 2 Score 0 0  Altered sleeping  0  Tired, decreased energy  0  Change in appetite  0  Feeling bad or failure about yourself   0  Trouble concentrating  0  Moving slowly or fidgety/restless  0  Suicidal thoughts  0  PHQ-9 Score  0  Difficult doing work/chores  Not difficult at all       No data to display          Medications: Outpatient Medications Prior to Visit  Medication Sig   ACCU-CHEK GUIDE test strip USE TWICE DAILY. MAY SUBSTITUTE TO ANY MANUFACTURER COVERED BY PATIENT'S INSURANCE.   amLODipine (NORVASC) 5 MG tablet Take 1 tablet daily x 2 weeks; increase to 2 tablets daily until you follow up with PCP   Blood Glucose Monitoring Suppl DEVI 1 each by Does not apply route in the morning, at noon, and at bedtime. May substitute to any manufacturer covered by patient's insurance.   empagliflozin (JARDIANCE) 10 MG TABS tablet Take 1 tablet (10 mg total) by mouth daily before breakfast.   EPINEPHrine 0.3 mg/0.3 mL IJ SOAJ injection Inject 0.3 mg into the muscle once as needed (SOB/ ANAPHYLAXIS / ANGIOEDEMA. / HYPERSENSITIVITY REACTION/).   gabapentin (NEURONTIN) 100 MG capsule Take 1 capsule (100 mg total) by mouth 3 (three) times daily.   Lancet Device MISC 1 each by Does not apply route in the morning, at noon, and at bedtime. May substitute to any manufacturer covered by patient's insurance.   Lancets Misc. MISC 1 each by Does not  apply route daily before breakfast. May substitute to any manufacturer covered by patient's insurance.   loratadine (CLARITIN) 10 MG tablet Take 10 mg by mouth daily as needed for allergies.   meloxicam (MOBIC) 7.5 MG tablet Take 7.5 mg by mouth daily.   norethindrone (MICRONOR) 0.35 MG tablet Take 1 tablet (0.35 mg total) by mouth daily.   predniSONE (DELTASONE) 50 MG tablet Take 1 tablet (50 mg total) by mouth daily with breakfast.   rosuvastatin (CRESTOR) 5 MG tablet Take 1 tablet (5 mg total) by mouth daily.   sitaGLIPtin (JANUVIA) 100 MG tablet TAKE 1 TABLET BY MOUTH EVERY DAY   sulfamethoxazole-trimethoprim (BACTRIM DS) 800-160 MG tablet Take 1 tablet by mouth 2 (two) times daily.   triamcinolone ointment (KENALOG) 0.5 % Apply 1 Application topically 2 (two) times daily. Twice daily for 2 weeks for rash   No facility-administered medications prior to visit.    Review of Systems  All other systems reviewed and are negative.  Except see HPI   {Insert previous labs (optional):23779} {See past labs  Heme  Chem  Endocrine  Serology  Results Review (optional):1}   Objective    There were no vitals taken for this visit. {Insert last BP/Wt (optional):23777}{See vitals history (optional):1}  Physical Exam Constitutional:      General: She is not in acute distress.    Appearance: Normal appearance.  HENT:     Head: Normocephalic.  Pulmonary:     Effort: Pulmonary effort is normal. No respiratory distress.  Musculoskeletal:        General: Tenderness present. No swelling, deformity or signs of injury.     Right lower leg: No edema.     Left lower leg: No edema.  Neurological:     Mental Status: She is alert and oriented to person, place, and time. Mental status is at baseline.      No results found for any visits on 09/16/23.  Assessment & Plan    *** Assessment and Plan              No follow-ups on file.     The patient was advised to call back or seek an  in-person evaluation if the symptoms worsen or if the condition fails to improve as anticipated.  I discussed the assessment and treatment plan with the patient. The patient was provided an opportunity to ask questions and all were answered. The patient agreed with the plan and demonstrated an understanding of the instructions.  I, Debera Lat, PA-C have reviewed all documentation for this visit. The documentation on  09/16/23  for the exam, diagnosis, procedures, and orders are all accurate and complete.  Debera Lat, [facility], MMS [facility] [phone] (phone) [phone] (fax)  [facility]

## 2023-09-16 ENCOUNTER — Other Ambulatory Visit (HOSPITAL_COMMUNITY)
Admission: RE | Admit: 2023-09-16 | Discharge: 2023-09-16 | Disposition: A | Discharge status: Home / Self Care | Payer: Medicaid Other | Source: Ambulatory Visit | Attending: Physician Assistant | Admitting: Physician Assistant

## 2023-09-16 ENCOUNTER — Encounter: Payer: Self-pay | Admitting: Physician Assistant

## 2023-09-16 ENCOUNTER — Ambulatory Visit (INDEPENDENT_AMBULATORY_CARE_PROVIDER_SITE_OTHER): Payer: Medicaid Other | Admitting: Physician Assistant

## 2023-09-16 VITALS — BP 107/74 | HR 62 | Temp 97.5°F

## 2023-09-16 DIAGNOSIS — Z7984 Long term (current) use of oral hypoglycemic drugs: Secondary | ICD-10-CM | POA: Diagnosis not present

## 2023-09-16 DIAGNOSIS — E119 Type 2 diabetes mellitus without complications: Secondary | ICD-10-CM | POA: Diagnosis not present

## 2023-09-16 DIAGNOSIS — R300 Dysuria: Secondary | ICD-10-CM | POA: Diagnosis not present

## 2023-09-16 DIAGNOSIS — M549 Dorsalgia, unspecified: Secondary | ICD-10-CM

## 2023-09-16 DIAGNOSIS — N898 Other specified noninflammatory disorders of vagina: Secondary | ICD-10-CM

## 2023-09-16 LAB — POCT URINALYSIS DIPSTICK
Glucose, UA: POSITIVE — AB
Ketones, UA: NEGATIVE
Leukocytes, UA: NEGATIVE
Nitrite, UA: NEGATIVE
Protein, UA: NEGATIVE
Spec Grav, UA: 1.01 (ref 1.010–1.025)
Urobilinogen, UA: 0.2 U/dL
pH, UA: 5 (ref 5.0–8.0)

## 2023-09-18 LAB — URINALYSIS, MICROSCOPIC ONLY

## 2023-09-18 LAB — URINE CULTURE

## 2023-09-18 LAB — SPECIMEN STATUS REPORT

## 2023-09-19 ENCOUNTER — Ambulatory Visit: Payer: Medicaid Other | Admitting: Family Medicine

## 2023-09-22 ENCOUNTER — Other Ambulatory Visit: Payer: Self-pay | Admitting: Physician Assistant

## 2023-09-22 DIAGNOSIS — B3749 Other urogenital candidiasis: Secondary | ICD-10-CM

## 2023-09-22 LAB — CERVICOVAGINAL ANCILLARY ONLY
Bacterial Vaginitis (gardnerella): NEGATIVE
Candida Glabrata: POSITIVE — AB
Candida Vaginitis: NEGATIVE
Chlamydia: NEGATIVE
Comment: NEGATIVE
Comment: NEGATIVE
Comment: NEGATIVE
Comment: NEGATIVE
Comment: NEGATIVE
Comment: NORMAL
Neisseria Gonorrhea: NEGATIVE
Trichomonas: NEGATIVE

## 2023-09-22 MED ORDER — FLUCONAZOLE 150 MG PO TABS
150.0000 mg | ORAL_TABLET | Freq: Once | ORAL | 1 refills | Status: AC
Start: 2023-09-22 — End: 2023-09-22

## 2023-10-20 ENCOUNTER — Other Ambulatory Visit: Payer: Self-pay | Admitting: Family Medicine

## 2023-10-20 DIAGNOSIS — E1165 Type 2 diabetes mellitus with hyperglycemia: Secondary | ICD-10-CM

## 2023-11-01 ENCOUNTER — Other Ambulatory Visit: Payer: Self-pay | Admitting: Family Medicine

## 2023-11-01 DIAGNOSIS — E1159 Type 2 diabetes mellitus with other circulatory complications: Secondary | ICD-10-CM

## 2023-11-03 NOTE — Telephone Encounter (Signed)
Requested medication (s) are due for refill today: yes  Requested medication (s) are on the active medication list:yes  Last refill:  09/05/23 #90  Future visit scheduled: yes  Notes to clinic:  called pt and made appt/ Assisted with Spanish interpreter Yarelly id: [phone] Pt missed appt - was seen for other issues   Requested Prescriptions  Pending Prescriptions Disp Refills   amLODipine (NORVASC) 5 MG tablet [Pharmacy Med Name: AMLODIPINE BESYLATE 5 MG TAB] 135 tablet 1    Sig: Take 1 tablet daily x 2 weeks; increase to 2 tablets daily until you follow up with PCP     Cardiovascular: Calcium Channel Blockers 2 Passed - 11/01/2023  1:32 PM      Passed - Last BP in normal range    BP Readings from Last 1 Encounters:  09/16/23 107/74         Passed - Last Heart Rate in normal range    Pulse Readings from Last 1 Encounters:  09/16/23 62         Passed - Valid encounter within last 6 months    Recent Outpatient Visits           1 month ago Back pain, unspecified back location, unspecified back pain laterality, unspecified chronicity   [place] [facility] [place], [place], PA-C   1 month ago Allergy, sequela   [facility] Merita Norton T, FNP   3 months ago Pain of right heel   [facility] Merita Norton T, FNP   4 months ago Uncontrolled type 2 diabetes mellitus with hyperglycemia [facility])   [facility] Pard[place], Monico Blitz, DO   5 months ago Uncontrolled type 2 diabetes mellitus with hyperglycemia [facility])    [facility] [place], [place], PA-C       Future Appointments             In 2 weeks Debera Lat, PA-C [facility]

## 2023-11-18 ENCOUNTER — Encounter: Payer: Self-pay | Admitting: Physician Assistant

## 2023-11-18 ENCOUNTER — Ambulatory Visit: Payer: Medicaid Other | Admitting: Physician Assistant

## 2023-11-18 ENCOUNTER — Other Ambulatory Visit (HOSPITAL_COMMUNITY)
Admission: RE | Admit: 2023-11-18 | Discharge: 2023-11-18 | Disposition: A | Discharge status: Home / Self Care | Payer: Medicaid Other | Source: Ambulatory Visit | Attending: Physician Assistant | Admitting: Physician Assistant

## 2023-11-18 VITALS — BP 110/79 | HR 90 | Ht 64.0 in | Wt 176.3 lb

## 2023-11-18 DIAGNOSIS — Z7984 Long term (current) use of oral hypoglycemic drugs: Secondary | ICD-10-CM

## 2023-11-18 DIAGNOSIS — E119 Type 2 diabetes mellitus without complications: Secondary | ICD-10-CM

## 2023-11-18 DIAGNOSIS — N898 Other specified noninflammatory disorders of vagina: Secondary | ICD-10-CM

## 2023-11-18 DIAGNOSIS — H6123 Impacted cerumen, bilateral: Secondary | ICD-10-CM | POA: Diagnosis not present

## 2023-11-18 DIAGNOSIS — I152 Hypertension secondary to endocrine disorders: Secondary | ICD-10-CM | POA: Diagnosis not present

## 2023-11-18 DIAGNOSIS — E1159 Type 2 diabetes mellitus with other circulatory complications: Secondary | ICD-10-CM

## 2023-11-18 DIAGNOSIS — E66811 Obesity, class 1: Secondary | ICD-10-CM | POA: Diagnosis not present

## 2023-11-18 DIAGNOSIS — M549 Dorsalgia, unspecified: Secondary | ICD-10-CM

## 2023-11-18 NOTE — Progress Notes (Unsigned)
Established patient visit  Patient: Samantha Bonilla   DOB: [birth year]   42 y.o. Female  MRN: [id number] Visit Date: 11/18/2023  Today's healthcare provider: Debera Lat, PA-C   No chief complaint on file.  Subjective    HPI  *** Discussed the use of AI scribe software for clinical note transcription with the patient, who gave verbal consent to proceed.  History of Present Illness        Samantha Bonilla [id number]       06/25/2023    2:37 PM 04/16/2023    3:49 PM  Depression screen PHQ 2/9  Decreased Interest 0 0  Down, Depressed, Hopeless 0 0  PHQ - 2 Score 0 0  Altered sleeping  0  Tired, decreased energy  0  Change in appetite  0  Feeling bad or failure about yourself   0  Trouble concentrating  0  Moving slowly or fidgety/restless  0  Suicidal thoughts  0  PHQ-9 Score  0  Difficult doing work/chores  Not difficult at all       No data to display          Medications: Outpatient Medications Prior to Visit  Medication Sig  . ACCU-CHEK GUIDE test strip USE TWICE DAILY. MAY SUBSTITUTE TO ANY MANUFACTURER COVERED BY PATIENT'S INSURANCE.  Marland Kitchen amLODipine (NORVASC) 5 MG tablet TAKE 1 TABLET DAILY X 2 WEEKS INCREASE TO 2 TABLETS DAILY UNTIL YOU FOLLOW UP WITH PCP  . Blood Glucose Monitoring Suppl DEVI 1 each by Does not apply route in the morning, at noon, and at bedtime. May substitute to any manufacturer covered by patient's insurance.  . empagliflozin (JARDIANCE) 10 MG TABS tablet Take 1 tablet (10 mg total) by mouth daily before breakfast.  . EPINEPHrine 0.3 mg/0.3 mL IJ SOAJ injection Inject 0.3 mg into the muscle once as needed (SOB/ ANAPHYLAXIS / ANGIOEDEMA. / HYPERSENSITIVITY REACTION/).  Marland Kitchen gabapentin (NEURONTIN) 100 MG capsule Take 1 capsule (100 mg total) by mouth 3 (three) times daily.  Marland Kitchen JANUVIA 100 MG tablet TAKE 1 TABLET BY MOUTH EVERY DAY  . Lancet Device MISC 1 each by Does not apply route in the morning, at noon, and at bedtime. May substitute to any  manufacturer covered by patient's insurance.  . Lancets Misc. MISC 1 each by Does not apply route daily before breakfast. May substitute to any manufacturer covered by patient's insurance.  . loratadine (CLARITIN) 10 MG tablet Take 10 mg by mouth daily as needed for allergies.  . meloxicam (MOBIC) 7.5 MG tablet Take 7.5 mg by mouth daily.  . norethindrone (MICRONOR) 0.35 MG tablet Take 1 tablet (0.35 mg total) by mouth daily.  . predniSONE (DELTASONE) 50 MG tablet Take 1 tablet (50 mg total) by mouth daily with breakfast.  . rosuvastatin (CRESTOR) 5 MG tablet Take 1 tablet (5 mg total) by mouth daily.  Marland Kitchen sulfamethoxazole-trimethoprim (BACTRIM DS) 800-160 MG tablet Take 1 tablet by mouth 2 (two) times daily.  Marland Kitchen triamcinolone ointment (KENALOG) 0.5 % Apply 1 Application topically 2 (two) times daily. Twice daily for 2 weeks for rash   No facility-administered medications prior to visit.    Review of Systems  All other systems reviewed and are negative.  Except see HPI   {Insert previous labs (optional):23779} {See past labs  Heme  Chem  Endocrine  Serology  Results Review (optional):1}   Objective    There were no vitals taken for this visit. {Insert last BP/Wt (optional):23777}{See vitals history (optional):1}  Physical Exam Vitals reviewed.  Constitutional:      General: She is not in acute distress.    Appearance: Normal appearance. She is well-developed. She is not diaphoretic.  HENT:     Head: Normocephalic and atraumatic.  Eyes:     General: No scleral icterus.    Conjunctiva/sclera: Conjunctivae normal.  Neck:     Thyroid: No thyromegaly.  Cardiovascular:     Rate and Rhythm: Normal rate and regular rhythm.     Pulses: Normal pulses.     Heart sounds: Normal heart sounds. No murmur heard. Pulmonary:     Effort: Pulmonary effort is normal. No respiratory distress.     Breath sounds: Normal breath sounds. No wheezing, rhonchi or rales.  Musculoskeletal:      Cervical back: Neck supple.     Right lower leg: No edema.     Left lower leg: No edema.  Lymphadenopathy:     Cervical: No cervical adenopathy.  Skin:    General: Skin is warm and dry.     Findings: No rash.  Neurological:     Mental Status: She is alert and oriented to person, place, and time. Mental status is at baseline.  Psychiatric:        Mood and Affect: Mood normal.        Behavior: Behavior normal.     No results found for any visits on 11/18/23.  Assessment & Plan    *** Assessment and Plan              No follow-ups on file.    The patient was advised to call back or seek an in-person evaluation if the symptoms worsen or if the condition fails to improve as anticipated.  I discussed the assessment and treatment plan with the patient. The patient was provided an opportunity to ask questions and all were answered. The patient agreed with the plan and demonstrated an understanding of the instructions.  I, Debera Lat, PA-C have reviewed all documentation for this visit. The documentation on  11/18/23  for the exam, diagnosis, procedures, and orders are all accurate and complete.  Debera Lat, [facility], MMS [facility] [phone] (phone) [phone] (fax)   [facility]

## 2023-11-19 DIAGNOSIS — H6123 Impacted cerumen, bilateral: Secondary | ICD-10-CM | POA: Insufficient documentation

## 2023-11-19 DIAGNOSIS — E119 Type 2 diabetes mellitus without complications: Secondary | ICD-10-CM | POA: Insufficient documentation

## 2023-11-19 DIAGNOSIS — N898 Other specified noninflammatory disorders of vagina: Secondary | ICD-10-CM | POA: Insufficient documentation

## 2023-11-19 DIAGNOSIS — E66811 Obesity, class 1: Secondary | ICD-10-CM | POA: Insufficient documentation

## 2023-11-21 LAB — CERVICOVAGINAL ANCILLARY ONLY
Bacterial Vaginitis (gardnerella): NEGATIVE
Candida Glabrata: POSITIVE — AB
Candida Vaginitis: POSITIVE — AB
Comment: NEGATIVE
Comment: NEGATIVE
Comment: NEGATIVE

## 2024-01-06 DIAGNOSIS — E66811 Obesity, class 1: Secondary | ICD-10-CM | POA: Diagnosis not present

## 2024-01-06 DIAGNOSIS — E1159 Type 2 diabetes mellitus with other circulatory complications: Secondary | ICD-10-CM | POA: Diagnosis not present

## 2024-01-06 DIAGNOSIS — I152 Hypertension secondary to endocrine disorders: Secondary | ICD-10-CM | POA: Diagnosis not present

## 2024-01-06 DIAGNOSIS — E119 Type 2 diabetes mellitus without complications: Secondary | ICD-10-CM | POA: Diagnosis not present

## 2024-01-07 LAB — BASIC METABOLIC PANEL
BUN/Creatinine Ratio: 22 (ref 9–23)
BUN: 14 mg/dL (ref 6–24)
CO2: 22 mmol/L (ref 20–29)
Calcium: 9.1 mg/dL (ref 8.7–10.2)
Chloride: 105 mmol/L (ref 96–106)
Creatinine, Ser: 0.63 mg/dL (ref 0.57–1.00)
Glucose: 126 mg/dL — ABNORMAL HIGH (ref 70–99)
Potassium: 4.5 mmol/L (ref 3.5–5.2)
Sodium: 139 mmol/L (ref 134–144)
eGFR: 113 mL/min/{1.73_m2} (ref >59)

## 2024-01-07 LAB — LIPID PANEL
Chol/HDL Ratio: 2.5 {ratio} (ref 0.0–4.4)
Cholesterol, Total: 152 mg/dL (ref 100–199)
HDL: 60 mg/dL (ref >39)
LDL Chol Calc (NIH): 80 mg/dL (ref 0–99)
Triglycerides: 61 mg/dL (ref 0–149)
VLDL Cholesterol Cal: 12 mg/dL (ref 5–40)

## 2024-01-07 LAB — HEMOGLOBIN A1C
Est. average glucose Bld gHb Est-mCnc: 148 mg/dL
Hgb A1c MFr Bld: 6.8 % — ABNORMAL HIGH (ref 4.8–5.6)

## 2024-01-08 ENCOUNTER — Encounter: Payer: Self-pay | Admitting: Physician Assistant

## 2024-01-09 ENCOUNTER — Other Ambulatory Visit: Payer: Self-pay | Admitting: Family Medicine

## 2024-01-09 DIAGNOSIS — E1165 Type 2 diabetes mellitus with hyperglycemia: Secondary | ICD-10-CM

## 2024-01-09 NOTE — Telephone Encounter (Signed)
Unable to refill per protocol, Rx expired. Discontinued 09/05/23.  Requested Prescriptions  Pending Prescriptions Disp Refills   lisinopril (ZESTRIL) 2.5 MG tablet [Pharmacy Med Name: LISINOPRIL 2.5 MG TABLET] 90 tablet 1    Sig: TAKE 1 TABLET BY MOUTH EVERY DAY     Cardiovascular:  ACE Inhibitors Passed - 01/09/2024  8:17 AM      Passed - Cr in normal range and within 180 days    Creatinine, Ser  Date Value Ref Range Status  01/06/2024 0.63 0.57 - 1.00 mg/dL Final         Passed - K in normal range and within 180 days    Potassium  Date Value Ref Range Status  01/06/2024 4.5 3.5 - 5.2 mmol/L Final         Passed - Patient is not pregnant      Passed - Last BP in normal range    BP Readings from Last 1 Encounters:  11/18/23 110/79         Passed - Valid encounter within last 6 months    Recent Outpatient Visits           1 month ago Diabetes mellitus without complication [facility])   [place] [place], [place], PA-C   3 months ago Back pain, unspecified back location, unspecified back pain laterality, unspecified chronicity   [facility] [place], [place], PA-C   4 months ago Allergy, sequela   [facility] Merita Norton T, FNP   6 months ago Pain of right heel   [facility] Merita Norton T, FNP   6 months ago Uncontrolled type 2 diabetes mellitus with hyperglycemia [facility])   [facility] Pardue, Monico Blitz, DO

## 2024-02-11 DIAGNOSIS — M5412 Radiculopathy, cervical region: Secondary | ICD-10-CM | POA: Diagnosis not present

## 2024-02-16 DIAGNOSIS — E119 Type 2 diabetes mellitus without complications: Secondary | ICD-10-CM | POA: Diagnosis not present

## 2024-04-13 ENCOUNTER — Telehealth: Payer: Self-pay

## 2024-04-13 DIAGNOSIS — E1159 Type 2 diabetes mellitus with other circulatory complications: Secondary | ICD-10-CM

## 2024-04-19 ENCOUNTER — Telehealth: Payer: Self-pay

## 2024-04-19 NOTE — Progress Notes (Signed)
 Complex Care Management Note Care Guide Note  04/19/2024 Name: Samantha Bonilla MRN: [id number] DOB: March 31, 1981   Complex Care Management Outreach Attempts: An unsuccessful telephone outreach was attempted today to offer the patient information about available complex care management services.  Follow Up Plan:  Additional outreach attempts will be made to offer the patient complex care management information and services.   Encounter Outcome:  No Answer  Lenton Rail , RMA     [place]  [facility], [facility] Guide  Direct Dial: [phone]  Website: [email or web address]

## 2024-04-29 NOTE — Progress Notes (Signed)
 Care Guide Pharmacy Note  04/29/2024 Name: Lynette Pettit MRN: [id number] DOB: 01-11-1981  Referred By: Carlean Charter, DO Reason for referral: Complex Care Management (Outreach to schedule with RNCM)   Riayn Gilboy is a 43 y.o. year old female who is a primary care patient of Pardue, Asencion Blacksmith, DO.  Tachina Goldsby was referred to the pharmacist for assistance related to: DMII  A second unsuccessful telephone outreach was attempted today to contact the patient who was referred to the pharmacy team for assistance with medication management. Additional attempts will be made to contact the patient.  Lenton Rail , RMA     [facility]  [facility], [facility] Guide  Direct Dial: [phone]  Website: [email or web address]

## 2024-05-05 NOTE — Progress Notes (Signed)
 Complex Care Management Note Care Guide Note  05/05/2024 Name: Samantha Bonilla MRN: [id number] DOB: [birth year]   Complex Care Management Outreach Attempts: A third unsuccessful outreach was attempted today to offer the patient with information about available complex care management services.  Follow Up Plan:  No further outreach attempts will be made at this time. We have been unable to contact the patient to offer or enroll patient in complex care management services.  Encounter Outcome:  No Answer  Lenton Rail , RMA     [place]  [facility], [facility] Guide  Direct Dial: [phone]  Website: [email or web address]

## 2024-05-10 ENCOUNTER — Other Ambulatory Visit: Payer: Self-pay | Admitting: Family Medicine

## 2024-05-10 DIAGNOSIS — E1165 Type 2 diabetes mellitus with hyperglycemia: Secondary | ICD-10-CM
